# Patient Record
Sex: Male | Born: 1965 | Race: White | Hispanic: No | Marital: Married | Smoking: Current every day smoker
Health system: Southern US, Community
[De-identification: ages and names within clinical notes are randomized; demographics above are authoritative.]

## PROBLEM LIST (undated history)

## (undated) DIAGNOSIS — M05639 Rheumatoid arthritis of unspecified wrist with involvement of other organs and systems: Secondary | ICD-10-CM

## (undated) HISTORY — PX: NASAL SEPTUM SURGERY: SHX37

---

## 2020-11-07 ENCOUNTER — Other Ambulatory Visit: Payer: Self-pay

## 2020-11-07 ENCOUNTER — Ambulatory Visit (INDEPENDENT_AMBULATORY_CARE_PROVIDER_SITE_OTHER): Payer: 59

## 2020-11-07 ENCOUNTER — Ambulatory Visit
Admission: EM | Admit: 2020-11-07 | Discharge: 2020-11-07 | Disposition: A | Discharge status: Home / Self Care | Payer: 59

## 2020-11-07 DIAGNOSIS — M79671 Pain in right foot: Secondary | ICD-10-CM

## 2020-11-07 MED ORDER — COLCHICINE 0.6 MG PO TABS: 0.6000 mg | ORAL_TABLET | Freq: Every day | ORAL | 0 refills | Status: DC

## 2020-11-07 MED ORDER — DEXAMETHASONE SODIUM PHOSPHATE 10 MG/ML IJ SOLN
10.0000 mg | Freq: Once | INTRAMUSCULAR | Status: AC
  Administered 2020-11-07: 10 mg via INTRAMUSCULAR

## 2020-11-07 NOTE — ED Triage Notes (Signed)
Pt presents with right foot pain after stepping down from bed of truck, pain in in the ball of right foot

## 2020-11-07 NOTE — Discharge Instructions (Addendum)
Prescribed colchicine/take as directed Take OTC Tylenol as needed for pain Follow RICE instruction that is attached Follow-up with PCP Return or go to ED if you develop any new or worsening of

## 2020-11-07 NOTE — ED Provider Notes (Addendum)
[facility]   [id number] 11/07/20 Arrival Time: 1647   Chief Complaint  Patient presents with  . Foot Pain     SUBJECTIVE: History from: patient.  Derek Keller is a 54 y.o. male who presented to the urgent care for complaint of right foot pain for the past few days.  I developed the symptom after stepping down in a hole.  He localizes the pain to the right foot he describes the pain as constant and achy.  He has tried OTC medications without relief.  His symptoms are made worse with ROM.  He denies similar symptoms in the past.  Denies chills, fever, nausea, vomiting, diarrhea  ROS: As per HPI.  All other pertinent ROS negative.     History reviewed. No pertinent past medical history. History reviewed. No pertinent surgical history. Allergies  Allergen Reactions  . Augmentin [Amoxicillin-Pot Clavulanate] Nausea And Vomiting   No current facility-administered medications on file prior to encounter.   Current Outpatient Medications on File Prior to Encounter  Medication Sig Dispense Refill  . cyanocobalamin 1000 MCG tablet Take by mouth.    Marland Kitchen azelastine (ASTELIN) 0.1 % nasal spray Place 1 spray into both nostrils 2 (two) times daily.    . cetirizine (ZYRTEC) 10 MG tablet Take by mouth.    . folic acid (FOLVITE) 1 MG tablet Take 1 mg by mouth daily.    . methotrexate 2.5 MG tablet Take 20 mg by mouth once a week.    . pantoprazole (PROTONIX) 40 MG tablet Take 40 mg by mouth daily.    Harriette Ohara XR 11 MG TB24 Take 1 tablet by mouth daily.     Social History   Socioeconomic History  . Marital status: Married    Spouse name: Not on file  . Number of children: Not on file  . Years of education: Not on file  . Highest education level: Not on file  Occupational History  . Not on file  Tobacco Use  . Smoking status: Current Some Day Smoker  . Smokeless tobacco: Never Used  Substance and Sexual Activity  . Alcohol use: Not Currently  . Drug use: Not Currently  .  Sexual activity: Not on file  Other Topics Concern  . Not on file  Social History Narrative  . Not on file   Social Determinants of Health   Financial Resource Strain:   . Difficulty of Paying Living Expenses: Not on file  Food Insecurity:   . Worried About Programme researcher, broadcasting/film/video in the Last Year: Not on file  . Ran Out of Food in the Last Year: Not on file  Transportation Needs:   . Lack of Transportation (Medical): Not on file  . Lack of Transportation (Non-Medical): Not on file  Physical Activity:   . Days of Exercise per Week: Not on file  . Minutes of Exercise per Session: Not on file  Stress:   . Feeling of Stress : Not on file  Social Connections:   . Frequency of Communication with Friends and Family: Not on file  . Frequency of Social Gatherings with Friends and Family: Not on file  . Attends Religious Services: Not on file  . Active Member of Clubs or Organizations: Not on file  . Attends Banker Meetings: Not on file  . Marital Status: Not on file  Intimate Partner Violence:   . Fear of Current or Ex-Partner: Not on file  . Emotionally Abused: Not on file  . Physically  Abused: Not on file  . Sexually Abused: Not on file   Family History  Problem Relation Age of Onset  . Hypertension Mother   . Hypertension Father     OBJECTIVE:  Vitals:   11/07/20 1707  BP: 126/76  Pulse: 85  Resp: 20  Temp: 98.8 F (37.1 C)  SpO2: 98%     Physical Exam Vitals and nursing note reviewed.  Constitutional:      General: He is not in acute distress.    Appearance: Normal appearance. He is normal weight. He is not ill-appearing, toxic-appearing or diaphoretic.  Cardiovascular:     Rate and Rhythm: Normal rate and regular rhythm.     Pulses: Normal pulses.     Heart sounds: Normal heart sounds. No murmur heard.  No friction rub. No gallop.   Pulmonary:     Effort: Pulmonary effort is normal. No respiratory distress.     Breath sounds: Normal breath  sounds. No stridor. No wheezing, rhonchi or rales.  Chest:     Chest wall: No tenderness.  Musculoskeletal:        General: Tenderness present.     Right foot: Tenderness present.     Left foot: Normal.     Comments: The right foot is without any obvious asymmetry when compared to the left foot.  There is no ecchymosis, open wound, lesion or warmth present.  Limited range of motion due to pain.  Neurovascular status intact  Neurological:     Mental Status: He is alert and oriented to person, place, and time.    LABS:  No results found for this or any previous visit (from the past 24 hour(s)).   RADIOLOGY:  DG Foot Complete Right  Result Date: 11/07/2020 CLINICAL DATA:  Right foot injury EXAM: RIGHT FOOT COMPLETE - 3+ VIEW COMPARISON:  None. FINDINGS: No definite acute displaced fracture or malalignment. Small ossicles adjacent to the head of the fifth metatarsal with adjacent nonspecific soft tissue calcification. No radiopaque foreign body. IMPRESSION: No definite acute osseous abnormality. Electronically Signed   By: Jasmine Pang M.D.   On: 11/07/2020 18:15     Right foot x-ray is negative for bony abnormality including fracture or dislocation.  I have reviewed the x-ray myself and the radiologist interpretation.  I am in agreement with the radiologist interpretation.   ASSESSMENT & PLAN:  1. Right foot pain     Meds ordered this encounter  Medications  . dexamethasone (DECADRON) injection 10 mg  . colchicine 0.6 MG tablet    Sig: Take 1 tablet (0.6 mg total) by mouth daily.    Dispense:  30 tablet    Refill:  0    Discharge instructions  Prescribed colchicine/take as directed Take OTC Tylenol as needed for pain Follow RICE instruction that is attached Follow-up with PCP Return or go to ED if you develop any new or worsening of   Reviewed expectations re: course of current medical issues. Questions answered. Outlined signs and symptoms indicating need for more  acute intervention. Patient verbalized understanding. After Visit Summary given.         Durward Parcel, FNP 11/07/20 1832    Durward Parcel, FNP 11/07/20 [phone]

## 2021-06-13 ENCOUNTER — Other Ambulatory Visit: Payer: Self-pay

## 2021-06-13 ENCOUNTER — Ambulatory Visit
Admission: EM | Admit: 2021-06-13 | Discharge: 2021-06-13 | Disposition: A | Discharge status: Home / Self Care | Payer: BC Managed Care – PPO

## 2021-06-13 ENCOUNTER — Encounter: Payer: Self-pay | Admitting: Emergency Medicine

## 2021-06-13 DIAGNOSIS — H66001 Acute suppurative otitis media without spontaneous rupture of ear drum, right ear: Secondary | ICD-10-CM | POA: Diagnosis not present

## 2021-06-13 HISTORY — DX: Rheumatoid arthritis of unspecified wrist with involvement of other organs and systems: M05.639

## 2021-06-13 MED ORDER — AMOXICILLIN 500 MG PO CAPS: 500.0000 mg | ORAL_CAPSULE | Freq: Two times a day (BID) | ORAL | 0 refills | Status: AC

## 2021-06-13 NOTE — ED Provider Notes (Signed)
[facility]   [id number] 06/13/21 Arrival Time: 9735  CC:EAR PAIN  SUBJECTIVE: History from: patient.  Derek Keller is a 55 y.o. male who presents with of RT ear pain x 1 day.  Admits to swimming.  Patient states the pain is constant and 2/10  in character.  Patient has tried left over ear drops without relief.  Symptoms are made worse with lying down.  Reports similar symptoms in the past    Denies fever, chills, fatigue, sinus pain, rhinorrhea, ear discharge, sore throat, SOB, wheezing, chest pain, nausea, changes in bowel or bladder habits.    ROS: As per HPI.  All other pertinent ROS negative.     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 Allergies  Allergen Reactions   Tropicamide Other (See Comments) and Palpitations    1/2% drops, optometry   Cat Hair Extract Other (See Comments)    Sneezing   Dust Mite Extract Other (See Comments)    Sneezing   Augmentin [Amoxicillin-Pot Clavulanate] Nausea And Vomiting   No current facility-administered medications on file prior to encounter.   Current Outpatient Medications on File Prior to Encounter  Medication Sig Dispense Refill   Abatacept (ORENCIA CLICKJECT) 125 MG/ML SOAJ Inject into the skin.     cetirizine (ZYRTEC) 10 MG tablet Take by mouth.     cyanocobalamin 1000 MCG tablet Take by mouth.     folic acid (FOLVITE) 1 MG tablet Take 1 mg by mouth daily.     methotrexate 2.5 MG tablet Take 20 mg by mouth once a week.     pantoprazole (PROTONIX) 40 MG tablet Take 40 mg by mouth daily.     prednisoLONE 5 MG TABS tablet Take 5 mg by mouth.     Social History   Socioeconomic History   Marital status: Married    Spouse name: Not on file   Number of children: Not on file   Years of education: Not on file   Highest education level: Not on file  Occupational History   Not on file  Tobacco Use   Smoking status:  Every Day   Smokeless tobacco: Never  Substance and Sexual Activity   Alcohol use: Not Currently   Drug use: Not Currently   Sexual activity: Not on file  Other Topics Concern   Not on file  Social History Narrative   Not on file   Social Determinants of Health   Financial Resource Strain: Not on file  Food Insecurity: Not on file  Transportation Needs: Not on file  Physical Activity: Not on file  Stress: Not on file  Social Connections: Not on file  Intimate Partner Violence: Not on file   Family History  Problem Relation Age of Onset   Hypertension Mother    Hypertension Father     OBJECTIVE:  Vitals:   06/13/21 0826 06/13/21 0827  BP: 120/65   Pulse: 78   Resp: 18   Temp: 98.5 F (36.9 C)   TempSrc: Oral   SpO2: 98%   Weight:  190 lb (86.2 kg)  Height:  6\' 1"  (1.854 m)     General appearance: alert; well-appearing, nontoxic; speaking in full sentences and tolerating own secretions HEENT: NCAT; Ears: LT EAC clear, RT EAC swollen with white exudate, unable to visualize TM, LT TM pearly gray; Eyes: PERRL.  EOM grossly intact.Nose: nares patent  without rhinorrhea, Throat: oropharynx clear, tonsils non erythematous or enlarged, uvula midline  Neck: supple without LAD Lungs: unlabored respirations, symmetrical air entry; cough: absent; no respiratory distress; CTAB Heart: regular rate and rhythm.  Skin: warm and dry Psychological: alert and cooperative; normal mood and affect  No results found.   ASSESSMENT & PLAN:  1. Non-recurrent acute suppurative otitis media of right ear without spontaneous rupture of tympanic membrane     Meds ordered this encounter  Medications   amoxicillin (AMOXIL) 500 MG capsule    Sig: Take 1 capsule (500 mg total) by mouth 2 (two) times daily for 10 days.    Dispense:  20 capsule    Refill:  0    Order Specific Question:   Supervising Provider    Answer:   Eustace Moore [[id number]]     Rest and drink plenty of  fluids Prescribed amoxicillin Take medication as directed and to completion Continue to use OTC ibuprofen and/ or tylenol as needed for pain control Follow up with PCP if symptoms persists Return here or go to the ER if you have any new or worsening symptoms   Reviewed expectations re: course of current medical issues. Questions answered. Outlined signs and symptoms indicating need for more acute intervention. Patient verbalized understanding. After Visit Summary given.          Rennis Harding, PA-C 06/13/21 0840

## 2021-06-13 NOTE — Discharge Instructions (Addendum)
Rest and drink plenty of fluids Prescribed amoxicillin Take medication as directed and to completion Continue to use OTC ibuprofen and/ or tylenol as needed for pain control Follow up with PCP if symptoms persists Return here or go to the ER if you have any new or worsening symptoms  

## 2021-06-13 NOTE — ED Triage Notes (Signed)
Pt here with right ear pain since yesterday morning. Tried to use abx drops her daughter had with no relief. States headache at base of skull as well. States he was swimming last week.

## 2021-12-07 ENCOUNTER — Other Ambulatory Visit: Payer: Self-pay

## 2021-12-07 ENCOUNTER — Ambulatory Visit (HOSPITAL_COMMUNITY)
Admission: EM | Admit: 2021-12-07 | Discharge: 2021-12-07 | Disposition: A | Discharge status: Home / Self Care | Payer: Worker's Compensation | Attending: Physician Assistant | Admitting: Physician Assistant

## 2021-12-07 ENCOUNTER — Encounter (HOSPITAL_COMMUNITY): Payer: Self-pay

## 2021-12-07 DIAGNOSIS — S0001XA Abrasion of scalp, initial encounter: Secondary | ICD-10-CM | POA: Diagnosis not present

## 2021-12-07 DIAGNOSIS — Z23 Encounter for immunization: Secondary | ICD-10-CM

## 2021-12-07 DIAGNOSIS — S0101XA Laceration without foreign body of scalp, initial encounter: Secondary | ICD-10-CM

## 2021-12-07 DIAGNOSIS — S0990XA Unspecified injury of head, initial encounter: Secondary | ICD-10-CM

## 2021-12-07 MED ORDER — TETANUS-DIPHTH-ACELL PERTUSSIS 5-2.5-18.5 LF-MCG/0.5 IM SUSY
0.5000 mL | PREFILLED_SYRINGE | Freq: Once | INTRAMUSCULAR | Status: AC
  Administered 2021-12-07: 0.5 mL via INTRAMUSCULAR

## 2021-12-07 MED ORDER — TETANUS-DIPHTH-ACELL PERTUSSIS 5-2.5-18.5 LF-MCG/0.5 IM SUSY
PREFILLED_SYRINGE | INTRAMUSCULAR | Status: AC
  Filled 2021-12-07: qty 0.5

## 2021-12-07 MED ORDER — BACITRACIN ZINC 500 UNIT/GM EX OINT
TOPICAL_OINTMENT | CUTANEOUS | Status: AC
  Filled 2021-12-07: qty 0.9

## 2021-12-07 MED ORDER — BACITRACIN ZINC 500 UNIT/GM EX OINT: TOPICAL_OINTMENT | Freq: Once | CUTANEOUS | Status: AC

## 2021-12-07 NOTE — ED Triage Notes (Signed)
Pt presents with superficial abrasion to front of head and laceration to right side of head from tree branch trunk.

## 2021-12-07 NOTE — ED Provider Notes (Signed)
[facility]    CSN: [id number] Arrival date & time: 12/07/21  1025      History   Chief Complaint Chief Complaint  Patient presents with   Laceration    HPI Derek Keller is a 55 y.o. male.   Patient here today for evaluation of head injury that occurred at work around 10 AM this morning. 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 He denies any LOC at time of accident. He has not had any neck pain, nausea, vomiting, vision changes or other concerns.   The history is provided by the patient.  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   History reviewed. No pertinent surgical history.     Home Medications    Prior to Admission medications   Medication Sig Start Date End Date Taking? Authorizing Provider  Abatacept (ORENCIA CLICKJECT) 0000000 MG/ML SOAJ Inject into the skin. 05/29/21   [provider]  cetirizine (ZYRTEC) 10 MG tablet Take by mouth.    [provider]  cyanocobalamin 1000 MCG tablet Take by mouth. 12/10/17   [provider]  folic acid (FOLVITE) 1 MG tablet Take 1 mg by mouth daily. 09/03/20   [provider]  methotrexate 2.5 MG tablet Take 20 mg by mouth once a week. 11/06/20   [provider]  pantoprazole (PROTONIX) 40 MG tablet Take 40 mg by mouth daily. 09/25/20   [provider]  prednisoLONE 5 MG TABS tablet Take 5 mg by mouth.    [provider]    Family History Family History  Problem Relation Age of Onset   Hypertension Mother    Hypertension Father     Social History Social History   Tobacco Use   Smoking status: Every Day   Smokeless  tobacco: Never  Substance Use Topics   Alcohol use: Not Currently   Drug use: Not Currently     Allergies   Tropicamide, Cat hair extract, Dust mite extract, and Augmentin [amoxicillin-pot clavulanate]   Review of Systems Review of Systems  Constitutional:  Negative for chills and fever.  Eyes:  Negative for discharge, redness and visual disturbance.  Respiratory:  Negative for shortness of breath.   Gastrointestinal:  Negative for nausea and vomiting.  Musculoskeletal:  Negative for neck pain.  Skin:  Positive for wound. Negative for color change.  Neurological:  Negative for dizziness and headaches.    Physical Exam Triage Vital Signs ED Triage Vitals  Enc Vitals Group     BP 12/07/21 1106 124/79     Pulse Rate 12/07/21 1103 68     Resp 12/07/21 1103 18     Temp 12/07/21 1103 98.6 F (37 C)     Temp Source 12/07/21 1103 Oral     SpO2 12/07/21 1103 99 %     Weight --      Height --      Head Circumference --      Peak Flow --      Pain Score 12/07/21 1103 5     Pain Loc --  Pain Edu? --      Excl. in [place]? --    No data found.  Updated Vital Signs BP 124/79    Pulse 68    Temp 98.6 F (37 C) (Oral)    Resp 18    SpO2 99%    Physical Exam Vitals and nursing note reviewed.  Constitutional:      General: He is not in acute distress.    Appearance: Normal appearance. He is not ill-appearing.  HENT:     Head: Normocephalic and atraumatic.     Nose: Nose normal. No congestion or rhinorrhea.  Eyes:     Conjunctiva/sclera: Conjunctivae normal.  Cardiovascular:     Rate and Rhythm: Normal rate.  Pulmonary:     Effort: Pulmonary effort is normal.  Skin:    Comments: More superficial V-shaped abrasion to midline frontal scalp with minimal active bleeding, ~ 7 cm laceration to right posterior temporal scalp with minimal active bleeding, wounds cleaned in triage- no FB noted  Neurological:     Mental Status: He is alert.  Psychiatric:        Mood and Affect:  Mood normal.        Behavior: Behavior normal.     UC Treatments / Results  Labs (all labs ordered are listed, but only abnormal results are displayed) Labs Reviewed - No data to display  EKG   Radiology No results found.  Procedures Laceration Repair  Date/Time: 12/07/2021 12:14 PM Performed by: Francene Finders, PA-C Authorized by: Francene Finders, PA-C   Consent:    Consent obtained:  Verbal   Consent given by:  Patient   Risks, benefits, and alternatives were discussed: yes     Risks discussed:  Infection and pain   Alternatives discussed:  No treatment Universal protocol:    Procedure explained and questions answered to patient or proxy's satisfaction: yes     Relevant documents present and verified: yes     Required blood products, implants, devices, and special equipment available: yes     Patient identity confirmed:  Provided demographic data Anesthesia:    Anesthesia method:  None Laceration details:    Location:  Scalp   Scalp location:  R temporal   Length (cm):  7 Exploration:    Contaminated: no   Treatment:    Area cleansed with:  Chlorhexidine   Amount of cleaning:  Standard Skin repair:    Repair method:  Staples   Number of staples:  6 Approximation:    Approximation:  Close Post-procedure details:    Dressing:  Open (no dressing)   Procedure completion:  Tolerated well, no immediate complications (including critical care time)  Medications Ordered in UC Medications  Tdap (BOOSTRIX) injection 0.5 mL (0.5 mLs Intramuscular Given 12/07/21 1145)  bacitracin ointment ( Topical Given 12/07/21 1159)    Initial Impression / Assessment and Plan / UC Course  I have reviewed the triage vital signs and the nursing notes.  Pertinent labs & imaging results that were available during my care of the patient were reviewed by me and considered in my medical decision making (see chart for details).   Tetanus vaccination administered in office. Laceration  repaired. Frontal abrasion cleaned and dressed. Discussed follow up with any concerns including those that may be related to concussion, etc. Recommended he return in 7-10 days for staple removal.  Final Clinical Impressions(s) / UC Diagnoses   Final diagnoses:  Injury of head, initial encounter  Laceration of scalp, initial  encounter  Abrasion of scalp, initial encounter     Discharge Instructions      Keep wounds clean, follow up in 7-10 days for staple removal.     ED Prescriptions   None    PDMP not reviewed this encounter.   Francene Finders, PA-C 12/07/21 1215

## 2021-12-07 NOTE — ED Triage Notes (Signed)
Pt reports incident occurred @ approx 1000. Denies any LOC, neck pain, HA, vision changes, or n/v.

## 2021-12-07 NOTE — Discharge Instructions (Signed)
Keep wounds clean, follow up in 7-10 days for staple removal.

## 2021-12-08 ENCOUNTER — Other Ambulatory Visit: Payer: Self-pay

## 2021-12-08 ENCOUNTER — Ambulatory Visit
Admission: EM | Admit: 2021-12-08 | Discharge: 2021-12-08 | Disposition: A | Discharge status: Home / Self Care | Payer: BC Managed Care – PPO

## 2021-12-08 ENCOUNTER — Encounter: Payer: Self-pay | Admitting: Emergency Medicine

## 2021-12-08 DIAGNOSIS — S0101XD Laceration without foreign body of scalp, subsequent encounter: Secondary | ICD-10-CM

## 2021-12-08 DIAGNOSIS — Z5189 Encounter for other specified aftercare: Secondary | ICD-10-CM

## 2021-12-08 NOTE — ED Provider Notes (Signed)
[facility]    CSN: [id number] Arrival date & time: 12/08/21  4098      History   Chief Complaint No chief complaint on file.   HPI Quintan Saldivar is a 56 y.o. male presenting for recheck of scalp laceration. Was last at our urgent care 12/07/21 for evaluation of this, per their note:   "Patient here today for evaluation of head injury that occurred at work around 10 AM this morning. 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 He denies any LOC at time of accident. He has not had any neck pain, nausea, vomiting, vision changes or other concerns."  6 staples were applied and Tdap administered. Patient states when he removed the dressing, he was dismayed to see that most of the staples did not actually crossed the line of the wound, and were not holding the wound together.  He is shaves his head regularly and so this is a problem as a large scar will make this more difficult.  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   History reviewed. No pertinent surgical history.     Home Medications    Prior to Admission medications   Medication Sig Start Date End Date Taking? Authorizing Provider  Abatacept (ORENCIA CLICKJECT) 125 MG/ML SOAJ Inject into the skin. 05/29/21   [provider]  cetirizine (ZYRTEC) 10 MG tablet Take by mouth.    [provider]  cyanocobalamin 1000 MCG tablet Take by mouth. 12/10/17   [provider]  folic acid (FOLVITE) 1 MG tablet Take 1 mg by mouth daily. 09/03/20   [provider]  methotrexate 2.5 MG tablet Take 20 mg by mouth once a week. 11/06/20   [provider]  pantoprazole (PROTONIX) 40 MG tablet Take 40  mg by mouth daily. 09/25/20   [provider]  prednisoLONE 5 MG TABS tablet Take 5 mg by mouth.    [provider]    Family History Family History  Problem Relation Age of Onset   Hypertension Mother    Hypertension Father     Social History Social History   Tobacco Use   Smoking status: Every Day   Smokeless tobacco: Never  Substance Use Topics   Alcohol use: Not Currently   Drug use: Not Currently     Allergies   Tropicamide, Cat hair extract, Dust mite extract, and Augmentin [amoxicillin-pot clavulanate]   Review of Systems Review of Systems  Skin:  Positive for wound.  All other systems reviewed and are negative.   Physical Exam Triage Vital Signs ED Triage Vitals  Enc Vitals Group     BP 12/08/21 0827 116/77     Pulse Rate 12/08/21 0827 81     Resp 12/08/21 0827 18     Temp 12/08/21 0827 97.6 F (36.4 C)     Temp Source 12/08/21 0827 Oral     SpO2 12/08/21 0827 100 %     Weight --      Height --      Head Circumference --      Peak Flow --  Pain Score 12/08/21 0828 0     Pain Loc --      Pain Edu? --      Excl. in [place]? --    No data found.  Updated Vital Signs BP 116/77 (BP Location: Right Arm)    Pulse 81    Temp 97.6 F (36.4 C) (Oral)    Resp 18    SpO2 100%   Visual Acuity Right Eye Distance:   Left Eye Distance:   Bilateral Distance:    Right Eye Near:   Left Eye Near:    Bilateral Near:     Physical Exam Vitals reviewed.  Constitutional:      General: He is not in acute distress.    Appearance: Normal appearance. He is not ill-appearing.  HENT:     Head: Normocephalic and atraumatic.  Pulmonary:     Effort: Pulmonary effort is normal.  Skin:    Comments: See image below Scalp: 6 staples in place, only top and bottom staple appear to be holding skin in place. Laceration is now well-scabbed into place.  Neurological:     General: No focal deficit present.     Mental Status: He is alert and oriented to  person, place, and time.  Psychiatric:        Mood and Affect: Mood normal.        Behavior: Behavior normal.        Thought Content: Thought content normal.        Judgment: Judgment normal.       UC Treatments / Results  Labs (all labs ordered are listed, but only abnormal results are displayed) Labs Reviewed - No data to display  EKG   Radiology No results found.  Procedures Procedures (including critical care time)  Medications Ordered in UC Medications - No data to display  Initial Impression / Assessment and Plan / UC Course  I have reviewed the triage vital signs and the nursing notes.  Pertinent labs & imaging results that were available during my care of the patient were reviewed by me and considered in my medical decision making (see chart for details).     This patient is a very pleasant 56 y.o. year old male presenting with wound check.   We last saw this patient 1 day ago, at that time 6 staples were placed for scalp laceration. Presenting today as he noticed at home the staples are not actually holding the wound in place. As it has been 24 hours since injury and the laceration is now well-scabbed into place, discussed treatment options; advised against removal of staples today and restapling as this would only serve to create more scar tissue at this point. He is in agreement. Return in 7 days for removal.   Tdap administered 12/07/21.  He is not immunocompromised.    Final Clinical Impressions(s) / UC Diagnoses   Final diagnoses:  Laceration of skin of scalp, subsequent encounter  Visit for wound check     Discharge Instructions      -Continue washing with gentle soap and water 1-2x daily -Cover while outside/working     ED Prescriptions   None    PDMP not reviewed this encounter.   Rhys Martini, PA-C 12/08/21 [phone]

## 2021-12-08 NOTE — ED Triage Notes (Signed)
Here for recheck of head laceration on head.  Staples were placed.  States he thinks that the staples were missed when closing the wound.

## 2021-12-08 NOTE — Discharge Instructions (Addendum)
-  Continue washing with gentle soap and water 1-2x daily -Cover while outside/working

## 2021-12-16 ENCOUNTER — Other Ambulatory Visit: Payer: Self-pay

## 2021-12-16 ENCOUNTER — Ambulatory Visit
Admission: RE | Admit: 2021-12-16 | Discharge: 2021-12-16 | Disposition: A | Discharge status: Home / Self Care | Payer: BC Managed Care – PPO | Source: Ambulatory Visit

## 2021-12-16 VITALS — BP 121/80 | HR 74 | Temp 98.0°F | Resp 18 | Ht 73.0 in | Wt 197.0 lb

## 2021-12-16 DIAGNOSIS — Z4802 Encounter for removal of sutures: Secondary | ICD-10-CM

## 2021-12-16 NOTE — ED Triage Notes (Addendum)
Pt here to have staples from top of head removed. Staples placed on 12/07/21.

## 2021-12-16 NOTE — ED Provider Notes (Signed)
[facility]   MRN: [id number] DOB: Dec 17, 1965  Subjective:   Derek Keller is a 56 y.o. male presenting for staple removal from having been placed on 12/07/2021.  No fever, drainage of pus or bleeding.  No current facility-administered medications for this encounter.  Current Outpatient Medications:    Abatacept (ORENCIA CLICKJECT) 125 MG/ML SOAJ, Inject into the skin., Disp: , Rfl:    cetirizine (ZYRTEC) 10 MG tablet, Take by mouth., Disp: , Rfl:    cyanocobalamin 1000 MCG tablet, Take by mouth., Disp: , Rfl:    folic acid (FOLVITE) 1 MG tablet, Take 1 mg by mouth daily., Disp: , Rfl:    methotrexate 2.5 MG tablet, Take 20 mg by mouth once a week., Disp: , Rfl:    pantoprazole (PROTONIX) 40 MG tablet, Take 40 mg by mouth daily., Disp: , Rfl:    prednisoLONE 5 MG TABS tablet, Take 5 mg by mouth., Disp: , Rfl:    Allergies  Allergen Reactions   Tropicamide Other (See Comments) and Palpitations    1/2% drops, optometry   Cat Hair Extract Other (See Comments)    Sneezing   Dust Mite Extract Other (See Comments)    Sneezing   Augmentin [Amoxicillin-Pot Clavulanate] Nausea And Vomiting    Past Medical History:  Diagnosis Date   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 No pertinent surgical history.  Family History  Problem Relation Age of Onset   Hypertension Mother    Hypertension Father     Social History   Tobacco Use   Smoking status: Every Day   Smokeless tobacco: Never  Substance Use Topics   Alcohol use: Not Currently   Drug use: Not Currently    ROS   Objective:   Vitals: BP 121/80 (BP Location: Right Arm)    Pulse 74    Temp 98 F (36.7 C) (Oral)    Resp 18    Ht 6\' 1"  (1.854 m)    Wt 197 lb (89.4 kg)    SpO2 99%    BMI 25.99 kg/m   Physical Exam Constitutional:      General: He is not in acute distress.    Appearance: Normal appearance. He is well-developed and normal weight. He  is not ill-appearing, toxic-appearing or diaphoretic.  HENT:     Head: Normocephalic and atraumatic.      Right Ear: External ear normal.     Left Ear: External ear normal.     Nose: Nose normal.     Mouth/Throat:     Pharynx: Oropharynx is clear.  Eyes:     General: No scleral icterus.       Right eye: No discharge.        Left eye: No discharge.     Extraocular Movements: Extraocular movements intact.  Cardiovascular:     Rate and Rhythm: Normal rate.  Pulmonary:     Effort: Pulmonary effort is normal.  Musculoskeletal:     Cervical back: Normal range of motion.  Neurological:     Mental Status: He is alert and oriented to person, place, and time.  Psychiatric:        Mood and Affect: Mood normal.        Behavior: Behavior normal.        Thought Content: Thought content normal.        Judgment: Judgment normal.    Staples removed in their entirety without incident.  Patient tolerated this well.  There was no wound dehiscence.  Assessment and Plan :   PDMP not reviewed this encounter.  1. Encounter for staple removal    Care reviewed, anticipatory guidance provided.  No need for further follow-up.   Wallis Bamberg, PA-C 12/16/21 1428

## 2022-02-28 IMAGING — DX DG FOOT COMPLETE 3+V*R*
3 series · 3 of 3 positions shown · non-contrast
Comparison: None.

CLINICAL DATA: Right foot injury

EXAM:
RIGHT FOOT COMPLETE - 3+ VIEW

[foot ap]
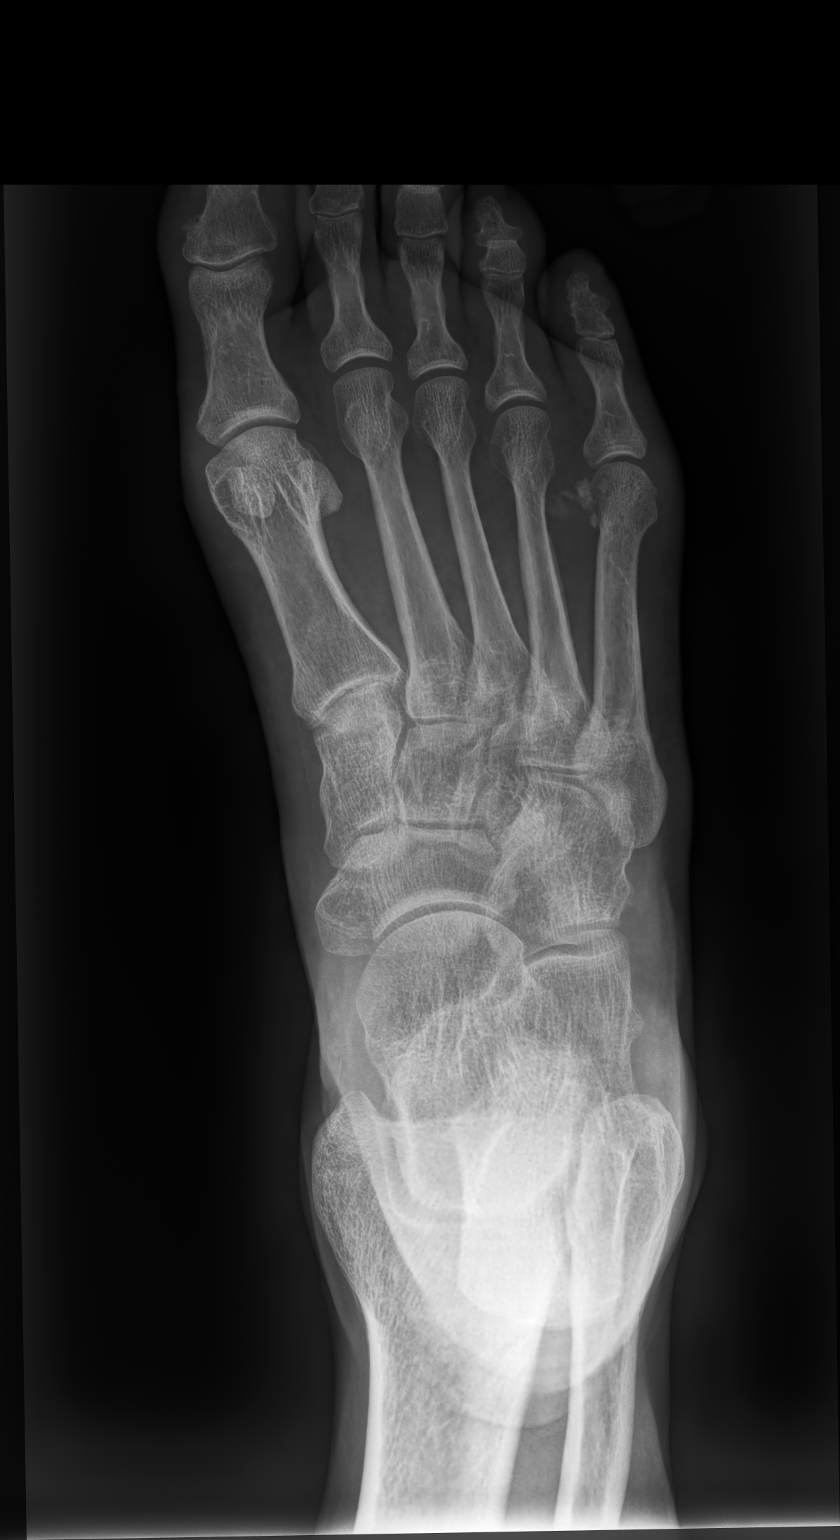

[foot mlo]
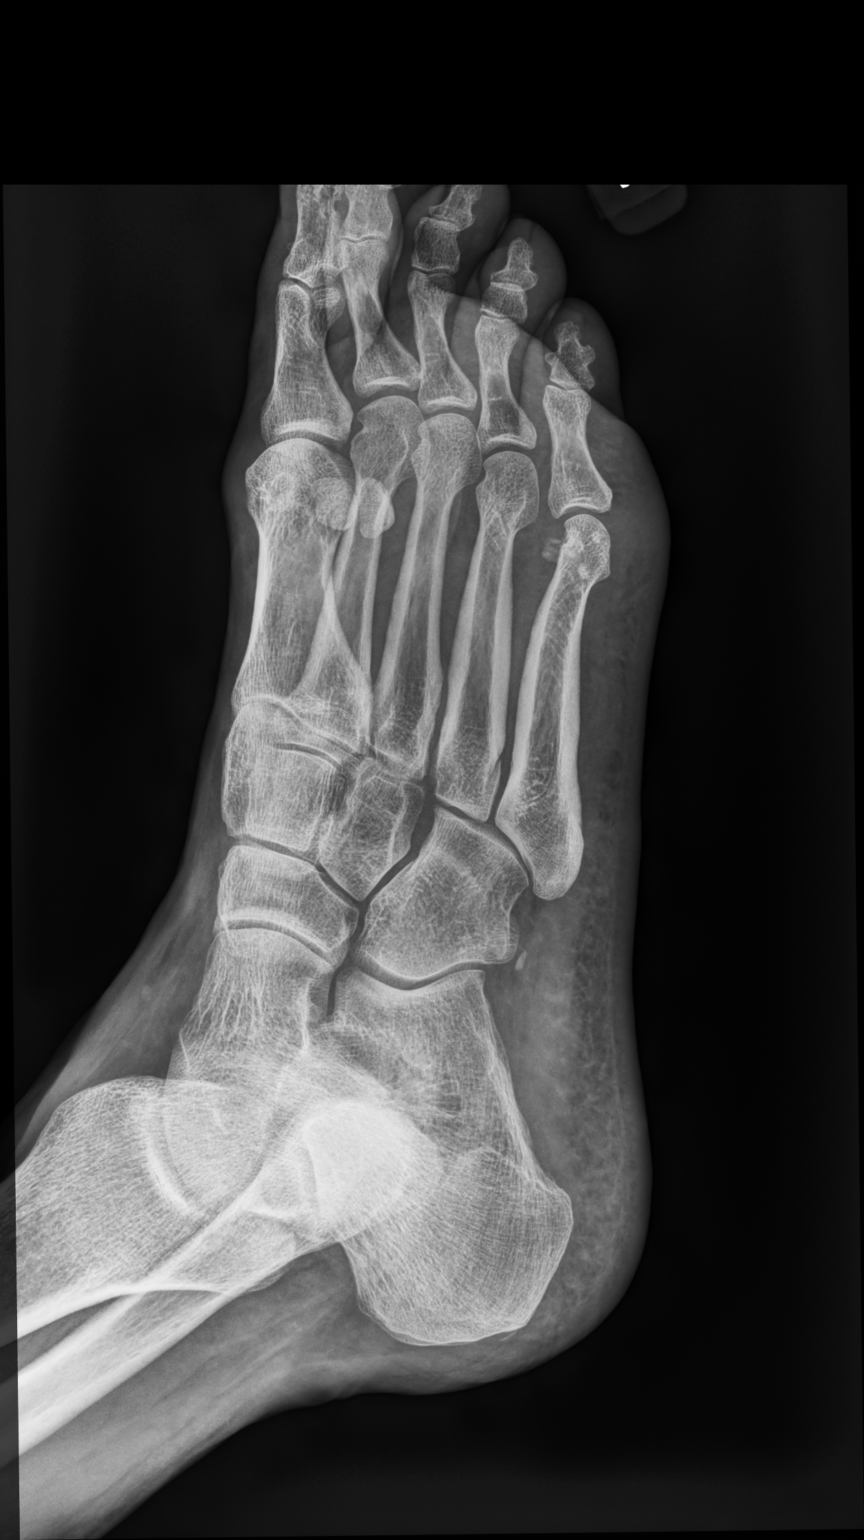

[foot lat]
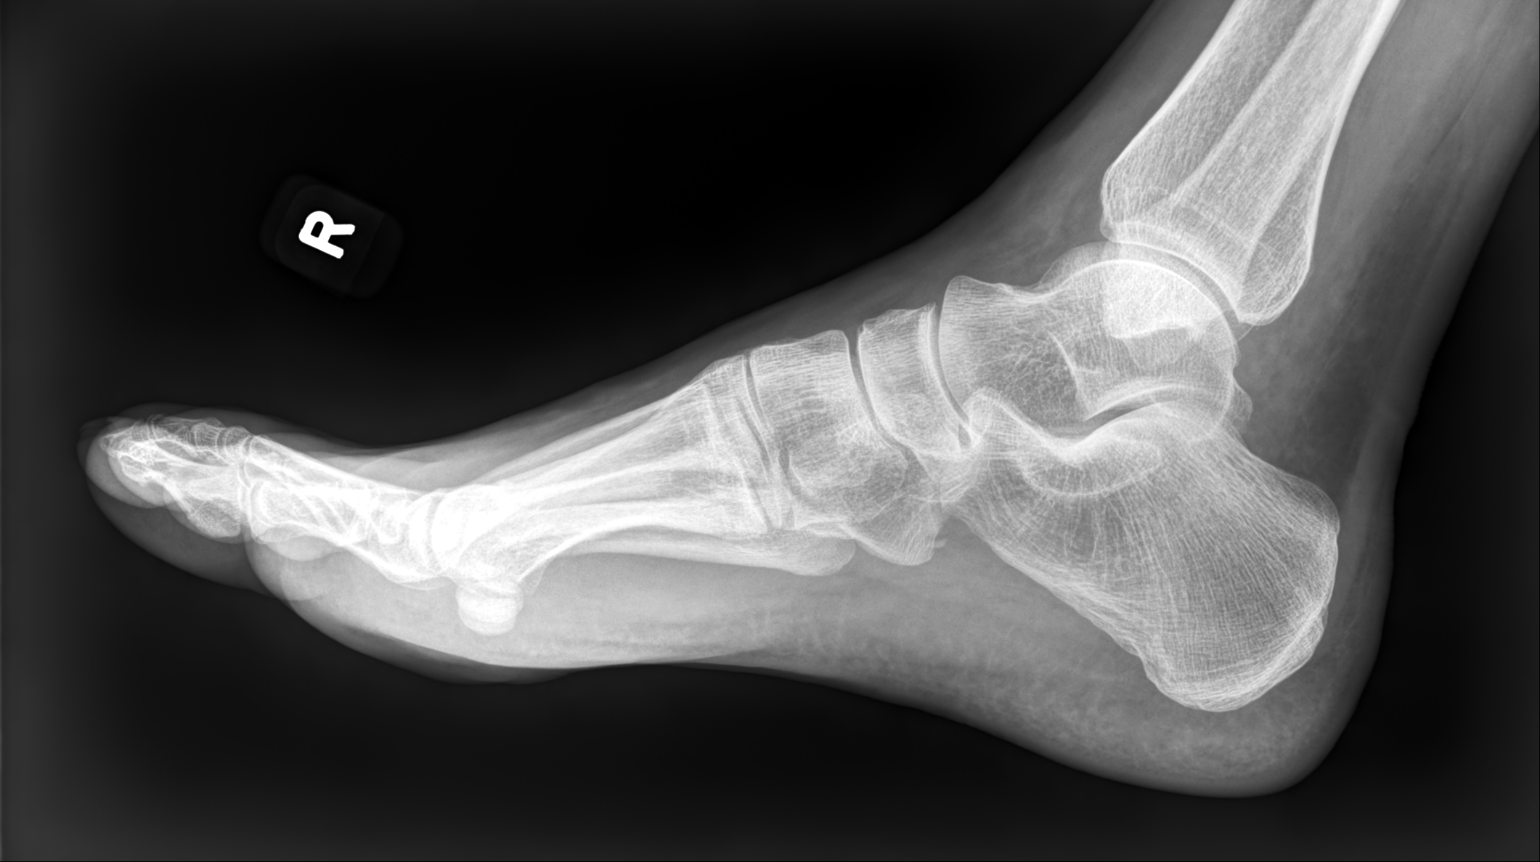

[3 of 3 positions shown; findings below may reference images not displayed]

FINDINGS: No definite acute displaced fracture or malalignment. Small ossicles
adjacent to the head of the fifth metatarsal with adjacent
nonspecific soft tissue calcification. No radiopaque foreign body.
IMPRESSION: No definite acute osseous abnormality.

## 2022-05-25 ENCOUNTER — Ambulatory Visit
Admission: RE | Admit: 2022-05-25 | Discharge: 2022-05-25 | Disposition: A | Discharge status: Home / Self Care | Payer: Managed Care, Other (non HMO) | Source: Ambulatory Visit

## 2022-05-25 VITALS — BP 112/75 | HR 73 | Temp 98.0°F | Resp 18

## 2022-05-25 DIAGNOSIS — H6123 Impacted cerumen, bilateral: Secondary | ICD-10-CM | POA: Diagnosis not present

## 2022-05-25 MED ORDER — CARBAMIDE PEROXIDE 6.5 % OT SOLN: 5.0000 [drp] | Freq: Every day | OTIC | 0 refills | Status: DC | PRN

## 2022-05-25 NOTE — ED Provider Notes (Signed)
[facility]   MRN: [id number] DOB: [birth year]  Subjective:   Derek Keller is a 56 y.o. male with PMH of rheumatoid arthritis presenting for 1 week history of persistent bilateral ear fullness and ear discomfort.  Patient has a history of difficulty with earwax buildup.  Has had minimal tenderness.  No drainage.  No fever.  He does have decreased hearing.  Does not use Q-tips.  No current facility-administered medications for this encounter.  Current Outpatient Medications:    omeprazole (PRILOSEC) 20 MG capsule, Take 20 mg by mouth daily., Disp: , Rfl:    Abatacept (ORENCIA CLICKJECT) 125 MG/ML SOAJ, Inject into the skin., Disp: , Rfl:    cetirizine (ZYRTEC) 10 MG tablet, Take by mouth., Disp: , Rfl:    cyanocobalamin 1000 MCG tablet, Take by mouth., Disp: , Rfl:    folic acid (FOLVITE) 1 MG tablet, Take 1 mg by mouth daily., Disp: , Rfl:    methotrexate 2.5 MG tablet, Take 20 mg by mouth once a week., Disp: , Rfl:    pantoprazole (PROTONIX) 40 MG tablet, Take 40 mg by mouth daily., Disp: , Rfl:    prednisoLONE 5 MG TABS tablet, Take 5 mg by mouth., Disp: , Rfl:    Allergies  Allergen Reactions   Tropicamide Other (See Comments) and Palpitations    1/2% drops, optometry   Cat Hair Extract Other (See Comments)    Sneezing   Dust Mite Extract Other (See Comments)    Sneezing   Augmentin [Amoxicillin-Pot Clavulanate] Nausea And Vomiting    Past Medical History:  Diagnosis Date   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 No pertinent surgical history.  Family History  Problem Relation Age of Onset   Hypertension Mother    Hypertension Father     Social History   Tobacco Use   Smoking status: Every Day   Smokeless tobacco: Never  Substance Use Topics   Alcohol use: Not Currently   Drug use: Not Currently    ROS   Objective:   Vitals: BP 112/75 (BP Location: Right Arm)   Pulse 73   Temp  98 F (36.7 C) (Oral)   Resp 18   SpO2 96%   Physical Exam Constitutional:      General: He is not in acute distress.    Appearance: Normal appearance. He is well-developed and normal weight. He is not ill-appearing, toxic-appearing or diaphoretic.  HENT:     Head: Normocephalic and atraumatic.     Right Ear: Tympanic membrane, ear canal and external ear normal. There is impacted cerumen.     Left Ear: Tympanic membrane, ear canal and external ear normal. There is impacted cerumen.     Nose: Nose normal.     Mouth/Throat:     Pharynx: Oropharynx is clear.  Eyes:     General: No scleral icterus.       Right eye: No discharge.        Left eye: No discharge.     Extraocular Movements: Extraocular movements intact.  Cardiovascular:     Rate and Rhythm: Normal rate.  Pulmonary:     Effort: Pulmonary effort is normal.  Musculoskeletal:     Cervical back: Normal range of motion.  Neurological:     Mental Status: He is alert and oriented to person, place, and time.  Psychiatric:        Mood and Affect: Mood  normal.        Behavior: Behavior normal.        Thought Content: Thought content normal.        Judgment: Judgment normal.    Ear lavage performed using mixture of peroxide and water.  Pressure irrigation performed using a bottle and a thin ear tube.  Bilateral ear lavage.  Curette was used for manual removal.  Assessment and Plan :   PDMP not reviewed this encounter.  1. Bilateral impacted cerumen    Bilateral ear lavage.  General management of cerumen impaction reviewed with patient.  Anticipatory guidance provided. Counseled patient on potential for adverse effects with medications prescribed/recommended today, ER and return-to-clinic precautions discussed, patient verbalized understanding.    Wallis Bamberg, [place] 05/25/22 1339

## 2022-05-25 NOTE — ED Triage Notes (Addendum)
Patient [place] and is now having pain to the left ear. He states he tried to irrigate his ears this morning and is now having muffled hearing from both ears.  The patient states his PCP is tapering him off of the prednisolone (for RA).    Started: Wednesday

## 2022-08-04 ENCOUNTER — Ambulatory Visit
Admission: RE | Admit: 2022-08-04 | Discharge: 2022-08-04 | Disposition: A | Discharge status: Home / Self Care | Payer: Managed Care, Other (non HMO) | Source: Ambulatory Visit | Attending: Family Medicine | Admitting: Family Medicine

## 2022-08-04 VITALS — BP 110/72 | HR 78 | Temp 98.4°F | Resp 16

## 2022-08-04 DIAGNOSIS — L0291 Cutaneous abscess, unspecified: Secondary | ICD-10-CM | POA: Diagnosis not present

## 2022-08-04 MED ORDER — DOXYCYCLINE HYCLATE 100 MG PO CAPS: 100.0000 mg | ORAL_CAPSULE | Freq: Two times a day (BID) | ORAL | 0 refills | Status: DC

## 2022-08-04 NOTE — ED Provider Notes (Signed)
[facility]    CSN: [id number] Arrival date & time: 08/04/22  1746      History   Chief Complaint Chief Complaint  Patient presents with   Abscess    Entered by patient    HPI Derek Keller is a 56 y.o. male.   Patient presenting today with a red swollen abscess type area to the lower abdomen that he first noticed a day or so ago.  It is tender, firm and not draining or bleeding per patient.  He is unsure what initially caused the area whether it was an insect bite or injury.  So far not trying anything over-the-counter for symptoms.  Has a history of MRSA infections.    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   History reviewed. No pertinent surgical history.     Home Medications    Prior to Admission medications   Medication Sig Start Date End Date Taking? Authorizing Provider  doxycycline (VIBRAMYCIN) 100 MG capsule Take 1 capsule (100 mg total) by mouth 2 (two) times daily. 08/04/22  Yes Particia Nearing, PA-C  Abatacept (ORENCIA CLICKJECT) 125 MG/ML SOAJ Inject into the skin. 05/29/21   [provider]  carbamide peroxide (DEBROX) 6.5 % OTIC solution Place 5 drops into both ears daily as needed. 05/25/22   Wallis Bamberg, PA-C  cetirizine (ZYRTEC) 10 MG tablet Take by mouth.    [provider]  cyanocobalamin 1000 MCG tablet Take by mouth. 12/10/17   [provider]  folic acid (FOLVITE) 1 MG tablet Take 1 mg by mouth daily. 09/03/20   [provider]  methotrexate 2.5 MG tablet Take 20 mg by mouth once a week. 11/06/20   [provider]  omeprazole (PRILOSEC) 20 MG capsule Take 20 mg by mouth daily.    [provider]  pantoprazole (PROTONIX) 40 MG tablet Take 40 mg by mouth daily. 09/25/20   [provider]  prednisoLONE 5 MG TABS tablet Take 5 mg by mouth.    [provider]    Family History Family History  Problem Relation Age of Onset   Hypertension Mother    Hypertension Father     Social History Social History   Tobacco Use   Smoking status: Every Day   Smokeless tobacco: Never  Substance Use Topics   Alcohol use: Not Currently   Drug use: Not Currently     Allergies   Tropicamide, Cat hair extract, Dust mite extract, and Augmentin [amoxicillin-pot clavulanate]   Review of Systems Review of Systems Per HPI  Physical Exam Triage Vital Signs ED Triage Vitals  Enc Vitals Group     BP 08/04/22 1759 110/72     Pulse Rate 08/04/22 1759 78     Resp 08/04/22 1759 16     Temp 08/04/22 1759 98.4 F (36.9 C)     Temp Source 08/04/22 1759 Oral     SpO2 08/04/22 1759 93 %     Weight --      Height --      Head Circumference --      Peak Flow --      Pain Score 08/04/22 1803 3     Pain Loc --      Pain Edu? --      Excl. in [place]? --    No data found.  Updated Vital Signs BP 110/72 (BP Location: Right Arm)   Pulse 78   Temp 98.4 F (36.9 C) (Oral)   Resp 16   SpO2 93%   Visual Acuity Right Eye Distance:   Left Eye Distance:   Bilateral Distance:    Right Eye Near:   Left Eye Near:    Bilateral Near:     Physical Exam Vitals and nursing note reviewed.  Constitutional:      Appearance: Normal appearance.  HENT:     Head: Atraumatic.  Eyes:     Extraocular Movements: Extraocular movements intact.     Conjunctiva/sclera: Conjunctivae normal.  Cardiovascular:     Rate and Rhythm: Normal rate and regular rhythm.  Pulmonary:     Effort: Pulmonary effort is normal.     Breath sounds: Normal breath sounds.  Musculoskeletal:        General: Normal range of motion.     Cervical back: Normal range of motion and neck supple.  Skin:    General: Skin is warm.     Comments: Firm erythematous 1 cm mass to the lower abdomen, tender to palpation.  No fluctuance or induration, no active drainage  Neurological:      General: No focal deficit present.     Mental Status: He is oriented to person, place, and time.  Psychiatric:        Mood and Affect: Mood normal.        Thought Content: Thought content normal.        Judgment: Judgment normal.      UC Treatments / Results  Labs (all labs ordered are listed, but only abnormal results are displayed) Labs Reviewed - No data to display  EKG   Radiology No results found.  Procedures Procedures (including critical care time)  Medications Ordered in UC Medications - No data to display  Initial Impression / Assessment and Plan / UC Course  I have reviewed the triage vital signs and the nursing notes.  Pertinent labs & imaging results that were available during my care of the patient were reviewed by me and considered in my medical decision making (see chart for details).     Cover with doxycycline, warm compresses.  Return for worsening symptoms.  No indication for I&D today. Final Clinical Impressions(s) / UC Diagnoses   Final diagnoses:  Abscess   Discharge Instructions   None    ED Prescriptions     Medication Sig Dispense Auth. Provider   doxycycline (VIBRAMYCIN) 100 MG capsule Take 1 capsule (100 mg total) by mouth 2 (two) times daily. 14 capsule Particia Nearing, [place]      PDMP not reviewed this encounter.   Particia Nearing, [place] 08/04/22 [phone]

## 2022-08-04 NOTE — ED Triage Notes (Signed)
Pt has small abscess on abdomen, thinks may be spider bite

## 2023-02-13 ENCOUNTER — Ambulatory Visit
Admission: EM | Admit: 2023-02-13 | Discharge: 2023-02-13 | Disposition: A | Discharge status: Home / Self Care | Payer: Managed Care, Other (non HMO)

## 2023-02-13 ENCOUNTER — Encounter: Payer: Self-pay | Admitting: Emergency Medicine

## 2023-02-13 ENCOUNTER — Other Ambulatory Visit: Payer: Self-pay

## 2023-02-13 DIAGNOSIS — S61432A Puncture wound without foreign body of left hand, initial encounter: Secondary | ICD-10-CM

## 2023-02-13 DIAGNOSIS — M7989 Other specified soft tissue disorders: Secondary | ICD-10-CM

## 2023-02-13 MED ORDER — DOXYCYCLINE HYCLATE 100 MG PO CAPS: 100.0000 mg | ORAL_CAPSULE | Freq: Two times a day (BID) | ORAL | 0 refills | Status: DC

## 2023-02-13 MED ORDER — CHLORHEXIDINE GLUCONATE 4 % EX LIQD: Freq: Every day | CUTANEOUS | 0 refills | Status: DC | PRN

## 2023-02-13 MED ORDER — MUPIROCIN 2 % EX OINT: 1.0000 | TOPICAL_OINTMENT | Freq: Two times a day (BID) | CUTANEOUS | 0 refills | Status: DC

## 2023-02-13 NOTE — ED Provider Notes (Signed)
[facility]    CSN: [id number] Arrival date & time: 02/13/23  1853      History   Chief Complaint Chief Complaint  Patient presents with   Puncture Wound    Splinter or insect stinger - Entered by patient    HPI Derek Keller is a 57 y.o. male.   Patient presenting today with an area of redness to the base of the left thumb that he first noticed yesterday.  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  He felt like there might be a piece of something stuck in the area so he used tweezers to take at the area last night and was unable to remove any pieces.  The area is red, swollen and painful today.  No drainage, bleeding, fevers, chills, decreased range of motion, numbness, tingling.  Not applying anything to the area.  Last tetanus was 2023.    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   History reviewed. No pertinent surgical history.     Home Medications    Prior to Admission medications   Medication Sig Start Date End Date Taking? Authorizing Provider  acetaminophen (TYLENOL) 650 MG CR tablet Take 650 mg by mouth every 8 (eight) hours as needed for pain.   Yes [provider]  chlorhexidine (HIBICLENS) 4 % external liquid Apply topically daily as needed. 02/13/23  Yes Volney American, PA-C  doxycycline (VIBRAMYCIN) 100 MG capsule Take 1 capsule (100 mg total) by mouth 2 (two) times daily. 02/13/23  Yes Volney American, PA-C  mupirocin ointment (BACTROBAN) 2 % Apply 1 Application topically 2 (two) times daily. 02/13/23  Yes Volney American, PA-C  riTUXimab-abbs (TRUXIMA) 100 MG/10ML injection Inject into the vein every 6 (six) months. Unknown dose   Yes [provider]  Abatacept  (ORENCIA CLICKJECT) 0000000 MG/ML SOAJ Inject into the skin. 05/29/21   [provider]  carbamide peroxide (DEBROX) 6.5 % OTIC solution Place 5 drops into both ears daily as needed. 05/25/22   Jaynee Eagles, PA-C  cetirizine (ZYRTEC) 10 MG tablet Take by mouth.    [provider]  cyanocobalamin 1000 MCG tablet Take by mouth. 12/10/17   [provider]  doxycycline (VIBRAMYCIN) 100 MG capsule Take 1 capsule (100 mg total) by mouth 2 (two) times daily. 08/04/22   Volney American, PA-C  folic acid (FOLVITE) 1 MG tablet Take 1 mg by mouth daily. 09/03/20   [provider]  methotrexate 2.5 MG tablet Take 20 mg by mouth once a week. 11/06/20   [provider]  omeprazole (PRILOSEC) 20 MG capsule Take 20 mg by mouth daily.    [provider]  pantoprazole (PROTONIX) 40 MG tablet Take 40 mg by mouth daily. 09/25/20   [provider]  prednisoLONE 5 MG TABS tablet Take 5 mg by mouth.    [provider]    Family History Family History  Problem Relation Age of Onset   Hypertension Mother    Hypertension Father     Social History Social History   Tobacco Use   Smoking status: Every Day    Packs/day: 1.5    Types: Cigarettes, Cigars  Smokeless tobacco: Never  Vaping Use   Vaping Use: Never used  Substance Use Topics   Alcohol use: Not Currently   Drug use: Not Currently     Allergies   Tropicamide, Cat hair extract, Dust mite extract, and Augmentin [amoxicillin-pot clavulanate]   Review of Systems Review of Systems PER HPI  Physical Exam Triage Vital Signs ED Triage Vitals  Enc Vitals Group     BP 02/13/23 1908 113/71     Pulse Rate 02/13/23 1908 79     Resp 02/13/23 1908 20     Temp 02/13/23 1908 98 F (36.7 C)     Temp Source 02/13/23 1908 Oral     SpO2 02/13/23 1908 97 %     Weight --      Height --      Head Circumference --      Peak Flow --      Pain Score 02/13/23 1909 2     Pain Loc --       Pain Edu? --      Excl. in [place]? --    No data found.  Updated Vital Signs BP 113/71 (BP Location: Right Arm)   Pulse 79   Temp 98 F (36.7 C) (Oral)   Resp 20   SpO2 97%   Visual Acuity Right Eye Distance:   Left Eye Distance:   Bilateral Distance:    Right Eye Near:   Left Eye Near:    Bilateral Near:     Physical Exam Vitals and nursing note reviewed.  Constitutional:      Appearance: Normal appearance.  HENT:     Head: Atraumatic.  Eyes:     Extraocular Movements: Extraocular movements intact.     Conjunctiva/sclera: Conjunctivae normal.  Cardiovascular:     Rate and Rhythm: Normal rate and regular rhythm.  Pulmonary:     Effort: Pulmonary effort is normal.     Breath sounds: Normal breath sounds.  Musculoskeletal:        General: Tenderness and signs of injury present. No swelling. Normal range of motion.     Cervical back: Normal range of motion and neck supple.  Skin:    General: Skin is warm and dry.     Comments: Small area of erythema and edema to the base of right thumb, no foreign body appreciable on exam, appears to have a small piece of scabbing.  Attempted removal as patient was concerned that this was a retained foreign body, did not extract any abnormal material and unable to visualize any evidence of foreign body post removing the scab  Neurological:     General: No focal deficit present.     Mental Status: He is oriented to person, place, and time.     Comments: Right hand neurovascularly intact  Psychiatric:        Mood and Affect: Mood normal.        Thought Content: Thought content normal.        Judgment: Judgment normal.      UC Treatments / Results  Labs (all labs ordered are listed, but only abnormal results are displayed) Labs Reviewed - No data to display  EKG   Radiology No results found.  Procedures Procedures (including critical care time)  Medications Ordered in UC Medications - No data to display  Initial Impression  / Assessment and Plan / UC Course  I have reviewed the triage vital signs and the nursing notes.  Pertinent labs & imaging results  that were available during my care of the patient were reviewed by me and considered in my medical decision making (see chart for details).     Probed the area after thorough sterilization with fine tooth forceps, removed small scab but no foreign body appreciable.  Suspect the area is red and swollen secondary to him taking at the area at home so much trying to get any possible foreign body out.  Will cover with antibiotics, Hibiclens, mupirocin, good home wound care, ice, elevation and return for worsening symptoms.  Final Clinical Impressions(s) / UC Diagnoses   Final diagnoses:  Swelling of left hand  Puncture wound of left hand without foreign body, initial encounter     Discharge Instructions      There does not appear to be any retained materials within the wound on your hand.  The swelling is likely secondary to the puncture and irritation from trying to dig something out.  You may perform warm Epsom salt soaks as needed, elevate the hand at rest to help with swelling, ice the area off-and-on.  I have sent over a course of antibiotics, complete the full course and perform once to twice a day wound care with the Hibiclens solution, apply the mupirocin ointment and a dressing.  Follow-up if worsening at any time    ED Prescriptions     Medication Sig Dispense Auth. Provider   doxycycline (VIBRAMYCIN) 100 MG capsule Take 1 capsule (100 mg total) by mouth 2 (two) times daily. 20 capsule Volney American, [place]   chlorhexidine (HIBICLENS) 4 % external liquid Apply topically daily as needed. 120 mL Volney American, PA-C   mupirocin ointment (BACTROBAN) 2 % Apply 1 Application topically 2 (two) times daily. 22 g Volney American, [place]      PDMP not reviewed this encounter.   Volney American, [place] 02/17/23 1133

## 2023-02-13 NOTE — Discharge Instructions (Signed)
There does not appear to be any retained materials within the wound on your hand.  The swelling is likely secondary to the puncture and irritation from trying to dig something out.  You may perform warm Epsom salt soaks as needed, elevate the hand at rest to help with swelling, ice the area off-and-on.  I have sent over a course of antibiotics, complete the full course and perform once to twice a day wound care with the Hibiclens solution, apply the mupirocin ointment and a dressing.  Follow-up if worsening at any time

## 2023-02-13 NOTE — ED Notes (Signed)
Bacitracin and bandaid applied to site. Pt tolerated well.  Site management and infection prevention education reviewed. Pt verbalized understanding.

## 2023-02-13 NOTE — ED Triage Notes (Addendum)
Small area of redness to left distal thumb/lower hand. Pt reports possible insect bite or piece off of power pole.   Last tetanus 2023.

## 2023-06-15 ENCOUNTER — Ambulatory Visit
Admission: EM | Admit: 2023-06-15 | Discharge: 2023-06-15 | Disposition: A | Discharge status: Home / Self Care | Payer: Managed Care, Other (non HMO) | Attending: Nurse Practitioner | Admitting: Nurse Practitioner

## 2023-06-15 DIAGNOSIS — S76212A Strain of adductor muscle, fascia and tendon of left thigh, initial encounter: Secondary | ICD-10-CM | POA: Diagnosis not present

## 2023-06-15 LAB — POCT URINALYSIS DIP (MANUAL ENTRY)
Bilirubin, UA: NEGATIVE
Blood, UA: NEGATIVE
Glucose, UA: NEGATIVE mg/dL
Ketones, POC UA: NEGATIVE mg/dL
Leukocytes, UA: NEGATIVE
Nitrite, UA: NEGATIVE
Protein Ur, POC: NEGATIVE mg/dL
Spec Grav, UA: 1.01 (ref 1.010–1.025)
Urobilinogen, UA: 0.2 E.U./dL
pH, UA: 6 (ref 5.0–8.0)

## 2023-06-15 MED ORDER — PREDNISONE 20 MG PO TABS: 40.0000 mg | ORAL_TABLET | Freq: Every day | ORAL | 0 refills | Status: AC

## 2023-06-15 MED ORDER — DEXAMETHASONE SODIUM PHOSPHATE 10 MG/ML IJ SOLN
10.0000 mg | INTRAMUSCULAR | Status: AC
  Administered 2023-06-15: 10 mg via INTRAMUSCULAR

## 2023-06-15 MED ORDER — KETOROLAC TROMETHAMINE 30 MG/ML IJ SOLN
30.0000 mg | Freq: Once | INTRAMUSCULAR | Status: AC
  Administered 2023-06-15: 30 mg via INTRAMUSCULAR

## 2023-06-15 NOTE — Discharge Instructions (Addendum)
Take medication as prescribed. May take over-the-counter Tylenol as needed for pain or discomfort. Recommend the use of ice or heat.  Apply ice for pain or swelling, heat for spasm or stiffness.  Apply for 20 minutes, remove for 1 hour, then repeat as needed. Gentle stretching or range of motion exercises to help with groin/hip pain. If you experience continued leakage, please follow-up with your primary care physician for further evaluation. Go to the emergency department immediately if you experience loss of bowel or bladder function, worsening pain, or other concerns. Follow-up as needed.

## 2023-06-15 NOTE — ED Triage Notes (Signed)
Pt reports he has a pulled muscle in his groin area on the left side x 1 day.   States he leaked on his way to the bathroom twice today that has never happened before. Slight abdominal pain when pressing.  Took robaxin for relief but provided now.

## 2023-06-15 NOTE — ED Provider Notes (Signed)
[facility]    CSN: [id number] Arrival date & time: 06/15/23  1536      History   Chief Complaint Chief Complaint  Patient presents with   Groin Pain    HPI Derek Keller is a 57 y.o. male.   The history is provided by the patient.   The patient presents with left groin pain that started 1 day ago.  Patient states that the day before symptoms started, he and his wife moved furniture.  He did not think it was anything strenuous, nor did he exert himself.  Patient states on yesterday, he attempted to get out of bed, and could not walk.  He continues to complain of pain to the left groin.  He states that he has decreased range of motion of the left lower extremity.  He also has tenderness to the groin area.  Patient states that he has difficulty straightening his leg due to the pain.  Patient states that the pain radiates into the left thigh.  Patient reports that he also noticed that he has had leakage when he was attempting to use the restroom.  He states this is new.  He states that he does have a history of urinary urgency from time to time due to the amount of caffeine that he drinks.  Patient denies fever, chills, chest pain, abdominal pain, nausea, vomiting, diarrhea, urinary frequency, urgency, hesitancy, low back pain, or flank pain.  Patient states that he took Robaxin for relief.  Patient reports that he does have a history of RA.  Reports that he takes methotrexate and Truxima.  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   History reviewed. No pertinent surgical history.     Home Medications    Prior to Admission medications   Medication Sig Start Date End Date Taking? Authorizing Provider  predniSONE (DELTASONE) 20 MG tablet Take 2 tablets (40 mg total) by mouth daily with breakfast for 5 days. 06/15/23 06/20/23 Yes Nelson Julson-Warren, Sadie Haber, NP  Abatacept (ORENCIA CLICKJECT) 125 MG/ML SOAJ Inject into the skin. 05/29/21   [provider]  acetaminophen (TYLENOL) 650 MG CR tablet Take 650 mg by mouth every 8 (eight) hours as needed for pain.    [provider]  carbamide peroxide (DEBROX) 6.5 % OTIC solution Place 5 drops into both ears daily as needed. 05/25/22   Wallis Bamberg, PA-C  cetirizine (ZYRTEC) 10 MG tablet Take by mouth.    [provider]  chlorhexidine (HIBICLENS) 4 % external liquid Apply topically daily as needed. 02/13/23   Particia Nearing, PA-C  cyanocobalamin 1000 MCG tablet Take by mouth. 12/10/17   [provider]  doxycycline (VIBRAMYCIN) 100 MG capsule Take 1 capsule (100 mg total) by mouth 2 (two) times daily. 08/04/22   Particia Nearing, PA-C  doxycycline (VIBRAMYCIN) 100 MG capsule Take 1 capsule (100 mg total) by mouth 2 (two) times daily. 02/13/23   Particia Nearing, PA-C  folic acid (FOLVITE) 1 MG tablet Take 1 mg by mouth daily. 09/03/20   [provider]  methotrexate 2.5 MG tablet Take 20 mg by mouth once a week. 11/06/20   [provider]  mupirocin ointment (BACTROBAN) 2 % Apply 1 Application topically 2 (two) times daily. 02/13/23   Particia Nearing, PA-C  omeprazole (PRILOSEC) 20 MG capsule Take 20  mg by mouth daily.    [provider]  pantoprazole (PROTONIX) 40 MG tablet Take 40 mg by mouth daily. 09/25/20   [provider]  prednisoLONE 5 MG TABS tablet Take 5 mg by mouth.    [provider]  riTUXimab-abbs (TRUXIMA) 100 MG/10ML injection Inject into the vein every 6 (six) months. Unknown dose    [provider]    Family History Family History  Problem Relation Age of Onset   Hypertension Mother    Hypertension Father     Social History Social History   Tobacco Use   Smoking status: Every Day    Current packs/day: 1.50    Types: Cigarettes, Cigars   Smokeless tobacco: Never   Vaping Use   Vaping status: Never Used  Substance Use Topics   Alcohol use: Not Currently   Drug use: Not Currently     Allergies   Tropicamide, Cat hair extract, Dust mite extract, and Augmentin [amoxicillin-pot clavulanate]   Review of Systems Review of Systems Per HPI  Physical Exam Triage Vital Signs ED Triage Vitals  Encounter Vitals Group     BP 06/15/23 1624 117/76     Systolic BP Percentile --      Diastolic BP Percentile --      Pulse Rate 06/15/23 1622 86     Resp 06/15/23 1622 20     Temp 06/15/23 1622 98.6 F (37 C)     Temp Source 06/15/23 1622 Oral     SpO2 06/15/23 1622 98 %     Weight --      Height --      Head Circumference --      Peak Flow --      Pain Score 06/15/23 1624 6     Pain Loc --      Pain Education --      Exclude from Growth Chart --    No data found.  Updated Vital Signs BP 117/76 (BP Location: Right Arm)   Pulse 86   Temp 98.6 F (37 C) (Oral)   Resp 20   SpO2 98%   Visual Acuity Right Eye Distance:   Left Eye Distance:   Bilateral Distance:    Right Eye Near:   Left Eye Near:    Bilateral Near:     Physical Exam Vitals and nursing note reviewed. Chaperone present: Chief Executive Officer.  Constitutional:      General: He is not in acute distress.    Appearance: Normal appearance.  HENT:     Head: Normocephalic.  Eyes:     Extraocular Movements: Extraocular movements intact.     Pupils: Pupils are equal, round, and reactive to light.  Cardiovascular:     Pulses: Normal pulses.     Heart sounds: Normal heart sounds.  Pulmonary:     Effort: Pulmonary effort is normal.     Breath sounds: Normal breath sounds.  Abdominal:     General: Bowel sounds are normal.     Palpations: Abdomen is soft.     Tenderness: There is no abdominal tenderness.     Hernia: There is no hernia in the left inguinal area or right inguinal area.  Genitourinary:    Penis: Normal.      Testes: Normal.     Epididymis:     Right: Normal.      Left: Normal.     Comments: Tenderness noted to the left groin. Musculoskeletal:     Cervical back: Normal range of  motion.     Left upper leg: Tenderness (Tenderness noted to the left groin) present. No swelling, edema, deformity or lacerations.     Comments: Decreased range of motion of the left lower extremity due to pain.   Lymphadenopathy:     Lower Body: No right inguinal adenopathy. No left inguinal adenopathy.  Skin:    General: Skin is warm and dry.  Neurological:     General: No focal deficit present.     Mental Status: He is alert and oriented to person, place, and time.  Psychiatric:        Mood and Affect: Mood normal.        Behavior: Behavior normal.      UC Treatments / Results  Labs (all labs ordered are listed, but only abnormal results are displayed) Labs Reviewed  POCT URINALYSIS DIP (MANUAL ENTRY)    EKG   Radiology No results found.  Procedures Procedures (including critical care time)  Medications Ordered in UC Medications  ketorolac (TORADOL) 30 MG/ML injection 30 mg (30 mg Intramuscular Given 06/15/23 1705)  dexamethasone (DECADRON) injection 10 mg (10 mg Intramuscular Given 06/15/23 1705)    Initial Impression / Assessment and Plan / UC Course  I have reviewed the triage vital signs and the nursing notes.  Pertinent labs & imaging results that were available during my care of the patient were reviewed by me and considered in my medical decision making (see chart for details).  The patient is well-appearing, he is in no acute distress, vital signs are stable.  Urinalysis was negative for urinary tract infection.  Suspect symptoms are related to a left groin strain/pull.  It is likely that symptoms may have started after the patient was moving the furniture.  Decadron 10 mg IM and Toradol 30 mg IM were administered for inflammation and pain.  Will start patient on prednisone 40 mg for the next 5 days.  Patient was given supportive care  recommendations to include use of over-the-counter Tylenol for pain or discomfort, use of ice or heat, and gentle stretching exercises.  Patient was advised to ensure it is safe for him to take prednisone when he sees his rheumatologist this week.  Patient was given strict ER follow-up precautions to include loss of bowel or bladder function, worsening pain, or other concerns.  Patient is in agreement with this plan of care and verbalizes understanding.  All questions were answered.  Patient stable for discharge.  Final Clinical Impressions(s) / UC Diagnoses   Final diagnoses:  Strain of groin, left, initial encounter     Discharge Instructions      Take medication as prescribed. May take over-the-counter Tylenol as needed for pain or discomfort. Recommend the use of ice or heat.  Apply ice for pain or swelling, heat for spasm or stiffness.  Apply for 20 minutes, remove for 1 hour, then repeat as needed. Gentle stretching or range of motion exercises to help with groin/hip pain. If you experience continued leakage, please follow-up with your primary care physician for further evaluation. Go to the emergency department immediately if you experience loss of bowel or bladder function, worsening pain, or other concerns. Follow-up as needed.      ED Prescriptions     Medication Sig Dispense Auth. Provider   predniSONE (DELTASONE) 20 MG tablet Take 2 tablets (40 mg total) by mouth daily with breakfast for 5 days. 10 tablet Joeli Fenner-Warren, Sadie Haber, NP      PDMP not reviewed this encounter.  Abran Cantor, NP 06/15/23 1713

## 2024-02-04 ENCOUNTER — Encounter: Payer: Self-pay | Admitting: *Deleted

## 2024-02-09 ENCOUNTER — Telehealth: Payer: Self-pay | Admitting: Internal Medicine

## 2024-02-09 NOTE — Telephone Encounter (Signed)
 Patient had called and left a message that we had a referral for him.  I called but left a message asking him to call the office back to schedule

## 2024-02-17 ENCOUNTER — Encounter: Payer: Self-pay | Admitting: Internal Medicine

## 2024-02-17 ENCOUNTER — Ambulatory Visit (INDEPENDENT_AMBULATORY_CARE_PROVIDER_SITE_OTHER): Admitting: Internal Medicine

## 2024-02-17 VITALS — BP 110/61 | HR 64 | Temp 97.9°F | Ht 71.0 in | Wt 200.4 lb

## 2024-02-17 DIAGNOSIS — R0789 Other chest pain: Secondary | ICD-10-CM

## 2024-02-17 DIAGNOSIS — R1310 Dysphagia, unspecified: Secondary | ICD-10-CM

## 2024-02-17 DIAGNOSIS — R1319 Other dysphagia: Secondary | ICD-10-CM

## 2024-02-17 DIAGNOSIS — K219 Gastro-esophageal reflux disease without esophagitis: Secondary | ICD-10-CM

## 2024-02-17 DIAGNOSIS — Z1211 Encounter for screening for malignant neoplasm of colon: Secondary | ICD-10-CM

## 2024-02-17 MED ORDER — PANTOPRAZOLE SODIUM 40 MG PO TBEC: 40.0000 mg | DELAYED_RELEASE_TABLET | Freq: Every day | ORAL | 11 refills | Status: DC

## 2024-02-17 NOTE — Patient Instructions (Signed)
 We will schedule you for upper endoscopy to further evaluate your reflux, difficulty swallowing as well as feeling of food getting stuck.  I may elect to stretch your esophagus depending on findings.  I am going to restart you on pantoprazole 40 mg daily.  I have sent this to your pharmacy.  At the same time as upper endoscopy, we will perform colonoscopy for colon cancer screening purposes.  It was very nice meeting you today.  Dr. Marletta Lor

## 2024-02-17 NOTE — Progress Notes (Signed)
 Primary Care Physician:  Samuella Bruin Primary Gastroenterologist:  Dr. Marletta Lor  Chief Complaint  Patient presents with   Dysphagia    Pt says when he swallows will get air bubble gets stuck or when eats and drinks will have trouble swallowing. He does gag on his own salvia.     HPI:   Derek Keller is a 58 y.o. male who presents to the clinic today by referral from his PCP Lenise Herald for evaluation.  GERD, esophageal dysphagia, chest pain: Patient has had reflux for over 20 years.  Previously on pantoprazole which controls his symptoms though states his insurance paying for this.  Now taking over-the-counter omeprazole 20 mg daily and having breakthrough symptoms of acid reflux and heartburn.  Also notes progressively worsening esophageal dysphagia.  Feels as though food and liquids will get stuck in his substernal region.  No previous upper endoscopy.  Does have rheumatoid arthritis with intermittent prednisone use.  Also takes Celebrex.  Intermittent chest pain when dysphagia symptoms occur.  Colon cancer screening: No prior colonoscopy.  No prior testing of any kind including Cologuard.  No family history colorectal malignancy.  No melena hematochezia.  No abdominal pain or unintentional weight loss.  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  Current Outpatient Medications  Medication Sig Dispense Refill   acetaminophen (TYLENOL) 650 MG CR tablet Take 650 mg by mouth every 8 (eight) hours as needed for pain.     acyclovir (ZOVIRAX) 400 MG tablet Take 400 mg by mouth 3 (three) times daily.     celecoxib (CELEBREX) 200 MG capsule Take 200 mg by mouth 2 (two) times daily.     cetirizine (ZYRTEC) 10 MG tablet Take by mouth.     cyanocobalamin (VITAMIN B12) 1000 MCG tablet Take 1,000 mcg by mouth daily.     folic acid (FOLVITE) 1 MG tablet Take 1 mg by mouth daily.      Methotrexate, PF, 25 MG/0.5ML SOAJ Inject 0.8 mLs into the skin once a week.     omeprazole (PRILOSEC) 20 MG capsule Take 20 mg by mouth daily.     Abatacept (ORENCIA CLICKJECT) 125 MG/ML SOAJ Inject into the skin. (Patient not taking: Reported on 02/17/2024)     carbamide peroxide (DEBROX) 6.5 % OTIC solution Place 5 drops into both ears daily as needed. (Patient not taking: Reported on 02/17/2024) 15 mL 0   chlorhexidine (HIBICLENS) 4 % external liquid Apply topically daily as needed. (Patient not taking: Reported on 02/17/2024) 120 mL 0   cyanocobalamin 1000 MCG tablet Take by mouth. (Patient not taking: Reported on 02/17/2024)     doxycycline (VIBRAMYCIN) 100 MG capsule Take 1 capsule (100 mg total) by mouth 2 (two) times daily. (Patient not taking: Reported on 02/17/2024) 14 capsule 0   doxycycline (VIBRAMYCIN) 100 MG capsule Take 1 capsule (100 mg total) by mouth 2 (two) times daily. (Patient not taking: Reported on 02/17/2024) 20 capsule 0   methotrexate 2.5 MG tablet Take 20 mg by mouth once a week. (Patient not taking: Reported on 02/17/2024)     mupirocin ointment (BACTROBAN) 2 % Apply 1 Application topically 2 (two) times daily. (Patient not taking: Reported on 02/17/2024) 22 g 0   pantoprazole (PROTONIX) 40 MG tablet Take 40 mg by mouth daily. (Patient not taking: Reported on 02/17/2024)     prednisoLONE  5 MG TABS tablet Take 5 mg by mouth. (Patient not taking: Reported on 02/17/2024)     riTUXimab-abbs (TRUXIMA) 100 MG/10ML injection Inject into the vein every 6 (six) months. Unknown dose     No current facility-administered medications for this visit.    Allergies as of 02/17/2024 - Review Complete 02/17/2024  Allergen Reaction Noted   Tropicamide Other (See Comments) and Palpitations 03/14/2014   Cat dander Other (See Comments) 05/16/2015   Dust mite extract Other (See Comments) 05/16/2015   Augmentin [amoxicillin-pot clavulanate] Nausea And Vomiting 11/07/2020    Family History   Problem Relation Age of Onset   Hypertension Mother    Hypertension Father     Social History   Socioeconomic History   Marital status: Married    Spouse name: Not on file   Number of children: Not on file   Years of education: Not on file   Highest education level: Not on file  Occupational History   Not on file  Tobacco Use   Smoking status: Every Day    Current packs/day: 1.50    Types: Cigarettes, Cigars   Smokeless tobacco: Never  Vaping Use   Vaping status: Never Used  Substance and Sexual Activity   Alcohol use: Not Currently   Drug use: Not Currently   Sexual activity: Not on file  Other Topics Concern   Not on file  Social History Narrative   Not on file   Social Drivers of Health   Financial Resource Strain: Low Risk  (01/12/2024)   Received from [facility]   Overall Financial Resource Strain (CARDIA)    Difficulty of Paying Living Expenses: Not hard at all  Food Insecurity: No Food Insecurity (01/12/2024)   Received [facility]   Hunger Vital Sign    Worried About Running Out of Food in the Last Year: Never true    Ran Out of Food in the Last Year: Never true  Transportation Needs: No Transportation Needs (01/12/2024)   Received from [facility] - Transportation    Lack of Transportation (Medical): No    Lack of Transportation (Non-Medical): No  Physical Activity: Unknown (10/25/2021)   Received from [facility], [facility]   Exercise Vital Sign    Days of Exercise per Week: Patient declined    Minutes of Exercise per Session: Not on file  Stress: Unknown (10/25/2021)   Received from [facility], [facility] - Occupational Stress Questionnaire    Feeling of Stress : Patient declined  Social Connections: Unknown (04/02/2022)   Received from [facility], [facility]   Social Network    Social Network: Not on file  Intimate Partner Violence: Unknown (03/05/2022)   Received  from [facility], [facility]   HITS    Physically Hurt: Not on file    Insult or Talk Down To: Not on file    Threaten Physical Harm: Not on file    Scream or Curse: Not on file    Subjective: Review of Systems  Constitutional:  Negative for chills and fever.  HENT:  Negative for congestion and hearing loss.   Eyes:  Negative for blurred vision and double vision.  Respiratory:  Negative for cough and shortness of breath.   Cardiovascular:  Negative for chest pain and palpitations.  Gastrointestinal:  Positive for heartburn. Negative for abdominal pain, blood in stool, constipation, diarrhea, melena and vomiting.       Dysphagia  Genitourinary:  Negative for dysuria and urgency.  Musculoskeletal:  Negative for joint pain and myalgias.  Skin:  Negative for itching and rash.  Neurological:  Negative for dizziness and headaches.  Psychiatric/Behavioral:  Negative for depression. The patient is not nervous/anxious.        Objective: BP 110/61 (BP Location: Right Arm, Patient Position: Sitting, Cuff Size: Large)   Pulse 64   Temp 97.9 F (36.6 C) (Oral)   Ht 5\' 11"  (1.803 m)   Wt 200 lb 6.4 oz (90.9 kg)   BMI 27.95 kg/m  Physical Exam Constitutional:      Appearance: Normal appearance.  HENT:     Head: Normocephalic and atraumatic.  Eyes:     Extraocular Movements: Extraocular movements intact.     Conjunctiva/sclera: Conjunctivae normal.  Cardiovascular:     Rate and Rhythm: Normal rate and regular rhythm.  Pulmonary:     Effort: Pulmonary effort is normal.     Breath sounds: Normal breath sounds.  Abdominal:     General: Bowel sounds are normal.     Palpations: Abdomen is soft.  Musculoskeletal:        General: Normal range of motion.     Cervical back: Normal range of motion and neck supple.  Skin:    General: Skin is warm.  Neurological:     General: No focal deficit present.     Mental Status: He is alert and oriented to person, place, and time.   Psychiatric:        Mood and Affect: Mood normal.        Behavior: Behavior normal.      Assessment/Plan:  1.  GERD, esophageal dysphagia, chest pain- Will schedule for EGD with possible dilation to evaluate for peptic ulcer disease, esophagitis, gastritis, H. Pylori, duodenitis, or other. Will also evaluate for esophageal stricture, Schatzki's ring, esophageal web or other.   I will restart pantoprazole 40 mg daily.  Good Rx coupon printed off for patient.  2.  Colon cancer screening-average risk, at same time as upper endoscopy, we will perform colonoscopy for colon cancer screening purposes.  The risks including infection, bleed, or perforation as well as benefits, limitations, alternatives and imponderables have been reviewed with the patient. Potential for esophageal dilation, biopsy, etc. have also been reviewed.  Questions have been answered. All parties agreeable.  Thank you Lenise Herald for the kind referral.   02/17/2024 8:32 AM   Disclaimer: This note was dictated with voice recognition software. Similar sounding words can inadvertently be transcribed and may not be corrected upon review.

## 2024-02-18 ENCOUNTER — Other Ambulatory Visit: Payer: Self-pay | Admitting: *Deleted

## 2024-02-18 ENCOUNTER — Telehealth: Payer: Self-pay | Admitting: *Deleted

## 2024-02-18 ENCOUNTER — Encounter: Payer: Self-pay | Admitting: *Deleted

## 2024-02-18 MED ORDER — PEG 3350-KCL-NA BICARB-NACL 420 G PO SOLR: 4000.0000 mL | Freq: Once | ORAL | 0 refills | Status: AC

## 2024-02-18 NOTE — Telephone Encounter (Signed)
 [employer] PA for EGD: CPT Code: GEEGD Description: EGD-esophagogastroduodenoscopy Authorization Number: [id number] Case Number: [id number] Review Date: 02/18/2024 2:11:03 PM Expiration Date: 08/16/2024 Status: Your case has been Approved. The prior authorization you submitted, Case [id number], has been received. Additional case status notifications will be sent if you opted in for email notifications. Thank you.

## 2024-03-22 ENCOUNTER — Other Ambulatory Visit: Payer: Self-pay

## 2024-03-22 ENCOUNTER — Encounter (HOSPITAL_COMMUNITY)
Admission: RE | Disposition: A | Discharge status: Home / Self Care | Payer: Self-pay | Source: Home / Self Care | Attending: Internal Medicine

## 2024-03-22 ENCOUNTER — Ambulatory Visit (HOSPITAL_COMMUNITY): Admitting: Certified Registered Nurse Anesthetist

## 2024-03-22 ENCOUNTER — Encounter (HOSPITAL_COMMUNITY): Payer: Self-pay | Admitting: Internal Medicine

## 2024-03-22 ENCOUNTER — Ambulatory Visit (HOSPITAL_COMMUNITY)
Admission: RE | Admit: 2024-03-22 | Discharge: 2024-03-22 | Disposition: A | Discharge status: Home / Self Care | Attending: Internal Medicine | Admitting: Internal Medicine

## 2024-03-22 DIAGNOSIS — K648 Other hemorrhoids: Secondary | ICD-10-CM | POA: Insufficient documentation

## 2024-03-22 DIAGNOSIS — K2970 Gastritis, unspecified, without bleeding: Secondary | ICD-10-CM

## 2024-03-22 DIAGNOSIS — F17210 Nicotine dependence, cigarettes, uncomplicated: Secondary | ICD-10-CM | POA: Insufficient documentation

## 2024-03-22 DIAGNOSIS — R1310 Dysphagia, unspecified: Secondary | ICD-10-CM | POA: Insufficient documentation

## 2024-03-22 DIAGNOSIS — K635 Polyp of colon: Secondary | ICD-10-CM | POA: Insufficient documentation

## 2024-03-22 DIAGNOSIS — Z1211 Encounter for screening for malignant neoplasm of colon: Secondary | ICD-10-CM | POA: Diagnosis not present

## 2024-03-22 DIAGNOSIS — K649 Unspecified hemorrhoids: Secondary | ICD-10-CM

## 2024-03-22 DIAGNOSIS — K219 Gastro-esophageal reflux disease without esophagitis: Secondary | ICD-10-CM | POA: Diagnosis not present

## 2024-03-22 HISTORY — PX: ESOPHAGOGASTRODUODENOSCOPY: SHX5428

## 2024-03-22 HISTORY — PX: ESOPHAGEAL DILATION: SHX303

## 2024-03-22 HISTORY — PX: COLONOSCOPY: SHX5424

## 2024-03-22 SURGERY — COLONOSCOPY
Anesthesia: General

## 2024-03-22 MED ORDER — LACTATED RINGERS IV SOLN: INTRAVENOUS | Status: DC | PRN

## 2024-03-22 MED ORDER — LANSOPRAZOLE 30 MG PO CPDR: 30.0000 mg | DELAYED_RELEASE_CAPSULE | Freq: Every day | ORAL | 11 refills | Status: AC

## 2024-03-22 MED ORDER — PROPOFOL 500 MG/50ML IV EMUL
INTRAVENOUS | Status: DC | PRN
  Administered 2024-03-22: 150 ug/kg/min via INTRAVENOUS
  Administered 2024-03-22: 100 mg via INTRAVENOUS

## 2024-03-22 MED ORDER — LIDOCAINE HCL (CARDIAC) PF 100 MG/5ML IV SOSY
PREFILLED_SYRINGE | INTRAVENOUS | Status: DC | PRN
  Administered 2024-03-22: 100 mg via INTRAVENOUS

## 2024-03-22 MED ORDER — LACTATED RINGERS IV SOLN: INTRAVENOUS | Status: DC

## 2024-03-22 MED ORDER — STERILE WATER FOR IRRIGATION IR SOLN
Status: DC | PRN
  Administered 2024-03-22: 120 mL

## 2024-03-22 NOTE — Op Note (Signed)
 [facility] Patient Name: Derek Keller Procedure Date: 03/22/2024 10:24 AM MRN: [id number] Date of Birth: Oct 27, 1966 Attending MD: Rolando Cliche. Mordechai April , [place], [id number] CSN: [id number] Age: 58 Admit Type: Ambulatory Procedure:                Colonoscopy Indications:              Screening for colorectal malignant neoplasm Providers:                Rolando Cliche. Mordechai April, DO, Willena Harp, [place]                            Wilson, Technician, Theola Fitch Referring MD:              Medicines:                See the Anesthesia note for documentation of the                            administered medications Complications:            No immediate complications. Estimated Blood Loss:     Estimated blood loss was minimal. Procedure:                Pre-Anesthesia Assessment:                           - The anesthesia plan was to use monitored                            anesthesia care (MAC).                           After obtaining informed consent, the colonoscope                            was passed under direct vision. Throughout the                            procedure, the patient's blood pressure, pulse, and                            oxygen saturations were monitored continuously. The                            PCF-HQ190L ([id number] scope was introduced through                            the anus and advanced to the the cecum, identified                            by appendiceal orifice and ileocecal valve. The                            colonoscopy was performed without difficulty. The                            patient tolerated the procedure  well. The quality                            of the bowel preparation was evaluated using the                            BBPS [facility] Bowel Preparation Scale) with scores                            of: Right Colon = 2 (minor amount of residual                            staining, small fragments of stool and/or opaque                             liquid, but mucosa seen well), Transverse Colon = 2                            (minor amount of residual staining, small fragments                            of stool and/or opaque liquid, but mucosa seen                            well) and Left Colon = 2 (minor amount of residual                            staining, small fragments of stool and/or opaque                            liquid, but mucosa seen well). The total BBPS score                            equals 6. The quality of the bowel preparation was                            good. Scope In: 10:52:12 AM Scope Out: 11:10:21 AM Scope Withdrawal Time: 0 hours 15 minutes 42 seconds  Total Procedure Duration: 0 hours 18 minutes 9 seconds  Findings:      Non-bleeding internal hemorrhoids were found.      A 3 mm polyp was found in the sigmoid colon. The polyp was sessile. The       polyp was removed with a cold snare. Resection and retrieval were       complete.      The exam was otherwise without abnormality. Impression:               - Non-bleeding internal hemorrhoids.                           - One 3 mm polyp in the sigmoid colon, removed with                            a cold snare. Resected  and retrieved.                           - The examination was otherwise normal. Moderate Sedation:      Per Anesthesia Care Recommendation:           - Patient has a contact number available for                            emergencies. The signs and symptoms of potential                            delayed complications were discussed with the                            patient. Return to normal activities tomorrow.                            Written discharge instructions were provided to the                            patient.                           - Resume previous diet.                           - Continue present medications.                           - Await pathology results.                           - Repeat colonoscopy  in 7-10 years for surveillance.                           - Return to GI clinic in 3 months. Procedure Code(s):        --- Professional ---                           [phone], Colonoscopy, flexible; with removal of                            tumor(s), polyp(s), or other lesion(s) by snare                            technique Diagnosis Code(s):        --- Professional ---                           Z12.11, Encounter for screening for malignant                            neoplasm of colon                           D12.5, Benign neoplasm of sigmoid colon  K64.8, Other hemorrhoids CPT copyright 2022 American Medical Association. All rights reserved. The codes documented in this report are preliminary and upon coder review may  be revised to meet current compliance requirements. Rolando Cliche. Mordechai April, DO Rolando Cliche. Mordechai April, DO 03/22/2024 11:12:42 AM This report has been signed electronically. Number of Addenda: 0

## 2024-03-22 NOTE — H&P (Signed)
 Primary Care Physician:  Jodi Munroe Primary Gastroenterologist:  Dr. Mordechai April  Pre-Procedure History & Physical: HPI:  Derek Keller is a 58 y.o. male is here for an EGD with possible dilation due to history of dysphagia, GERD and colonoscopy for colon cancer screening purposes.  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 Authorizing Provider  acetaminophen (TYLENOL) 650 MG CR tablet Take 650 mg by mouth every 8 (eight) hours as needed for pain.   Yes [provider]  acyclovir (ZOVIRAX) 400 MG tablet Take 400 mg by mouth 3 (three) times daily. 09/23/23  Yes [provider]  celecoxib (CELEBREX) 200 MG capsule Take 200 mg by mouth 2 (two) times daily.   Yes [provider]  cetirizine (ZYRTEC) 10 MG tablet Take by mouth.   Yes [provider]  cyanocobalamin (VITAMIN B12) 1000 MCG tablet Take 1,000 mcg by mouth daily.   Yes [provider]  folic acid (FOLVITE) 1 MG tablet Take 1 mg by mouth daily. 09/03/20  Yes [provider]  pantoprazole  (PROTONIX ) 40 MG tablet Take 1 tablet (40 mg total) by mouth daily. 02/17/24 02/16/25 Yes Michelle Wnek K, DO  Methotrexate, PF, 25 MG/0.5ML SOAJ Inject 0.8 mLs into the skin once a week.    [provider]    Allergies as of 02/18/2024 - Review Complete 02/17/2024  Allergen Reaction Noted   Tropicamide Other (See Comments) and Palpitations 03/14/2014   Cat dander Other (See Comments) 05/16/2015   Dust mite extract Other (See Comments) 05/16/2015   Augmentin [amoxicillin -pot clavulanate] Nausea And Vomiting 11/07/2020    Family History  Problem Relation Age of Onset   Hypertension Mother    Hypertension Father     Social History   Socioeconomic  History   Marital status: Married    Spouse name: Not on file   Number of children: Not on file   Years of education: Not on file   Highest education level: Not on file  Occupational History   Not on file  Tobacco Use   Smoking status: Every Day    Current packs/day: 1.50    Types: Cigarettes, Cigars   Smokeless tobacco: Never  Vaping Use   Vaping status: Never Used  Substance and Sexual Activity   Alcohol use: Not Currently   Drug use: Not Currently   Sexual activity: Not on file  Other Topics Concern   Not on file  Social History Narrative   Not on file   Social Drivers of Health   Financial Resource Strain: Low Risk  (01/12/2024)   Received from [facility]   Overall Financial Resource Strain (CARDIA)    Difficulty of Paying Living Expenses: Not hard at all  Food Insecurity: No Food Insecurity (01/12/2024)   Received from [facility]   Hunger Vital Sign    Worried About Running Out of Food in the Last Year: Never true    Ran Out of Food in the Last Year: Never true  Transportation Needs: No Transportation Needs (01/12/2024)   Received from [facility] - Transportation    Lack of Transportation (Medical): No    Lack of Transportation (Non-Medical): No  Physical Activity: Unknown (10/25/2021)   Received  from [facility], [facility]   Exercise Vital Sign    Days of Exercise per Week: Patient declined    Minutes of Exercise per Session: Not on file  Stress: Unknown (10/25/2021)   Received from [facility], [facility] - Occupational Stress Questionnaire    Feeling of Stress : Patient declined  Social Connections: Unknown (04/02/2022)   Received from [facility], [facility]   Social Network    Social Network: Not on file  Intimate Partner Violence: Unknown (03/05/2022)   Received from [facility], [facility]   HITS    Physically Hurt: Not on file    Insult or Talk Down To: Not on file     Threaten Physical Harm: Not on file    Scream or Curse: Not on file    Review of Systems: General: Negative for fever, chills, fatigue, weakness. Eyes: Negative for vision changes.  ENT: Negative for hoarseness, difficulty swallowing , nasal congestion. CV: Negative for chest pain, angina, palpitations, dyspnea on exertion, peripheral edema.  Respiratory: Negative for dyspnea at rest, dyspnea on exertion, cough, sputum, wheezing.  GI: See history of present illness. GU:  Negative for dysuria, hematuria, urinary incontinence, urinary frequency, nocturnal urination.  MS: Negative for joint pain, low back pain.  Derm: Negative for rash or itching.  Neuro: Negative for weakness, abnormal sensation, seizure, frequent headaches, memory loss, confusion.  Psych: Negative for anxiety, depression Endo: Negative for unusual weight change.  Heme: Negative for bruising or bleeding. Allergy: Negative for rash or hives.  Physical Exam: Vital signs in last 24 hours: Temp:  [98.2 F (36.8 C)] 98.2 F (36.8 C) (04/22 0917) Pulse Rate:  [62] 62 (04/22 0917) Resp:  [20] 20 (04/22 0917) BP: (129)/(78) 129/78 (04/22 0917) SpO2:  [97 %] 97 % (04/22 0917) Weight:  [88.5 kg] 88.5 kg (04/22 0917)   General:   Alert,  Well-developed, well-nourished, pleasant and cooperative in NAD Head:  Normocephalic and atraumatic. Eyes:  Sclera clear, no icterus.   Conjunctiva pink. Ears:  Normal auditory acuity. Nose:  No deformity, discharge,  or lesions. Msk:  Symmetrical without gross deformities. Normal posture. Extremities:  Without clubbing or edema. Neurologic:  Alert and  oriented x4;  grossly normal neurologically. Skin:  Intact without significant lesions or rashes. Psych:  Alert and cooperative. Normal mood and affect.   Impression/Plan: Derek Keller is here for an EGD with possible dilation due to history of dysphagia, GERD and colonoscopy for colon cancer screening purposes.  Risks, benefits,  limitations, imponderables and alternatives regarding procedure have been reviewed with the patient. Questions have been answered. All parties agreeable.

## 2024-03-22 NOTE — Transfer of Care (Signed)
 Immediate Anesthesia Transfer of Care Note  Patient: Derek Keller  Procedure(s) Performed: COLONOSCOPY EGD (ESOPHAGOGASTRODUODENOSCOPY) DILATION, ESOPHAGUS  Patient Location: Endoscopy Unit  Anesthesia Type:General  Level of Consciousness: awake, alert , and oriented  Airway & Oxygen Therapy: Patient Spontanous Breathing  Post-op Assessment: Report given to RN and Post -op Vital signs reviewed and stable  Post vital signs: Reviewed and stable  Last Vitals:  Vitals Value Taken Time  BP 113/68 03/22/24 1115  Temp 36.7 C 03/22/24 1113  Pulse 61 03/22/24 1115  Resp 21 03/22/24 1115  SpO2 99 % 03/22/24 1115    Last Pain:  Vitals:   03/22/24 1113  TempSrc: Oral  PainSc: 0-No pain      Patients Stated Pain Goal: 5 (03/22/24 0917)  Complications: No notable events documented.

## 2024-03-22 NOTE — Discharge Instructions (Addendum)
 EGD Discharge instructions Please read the instructions outlined below and refer to this sheet in the next few weeks. These discharge instructions provide you with general information on caring for yourself after you leave the hospital. Your doctor may also give you specific instructions. While your treatment has been planned according to the most current medical practices available, unavoidable complications occasionally occur. If you have any problems or questions after discharge, please call your doctor. ACTIVITY You may resume your regular activity but move at a slower pace for the next 24 hours.  Take frequent rest periods for the next 24 hours.  Walking will help expel (get rid of) the air and reduce the bloated feeling in your abdomen.  No driving for 24 hours (because of the anesthesia (medicine) used during the test).  You may shower.  Do not sign any important legal documents or operate any machinery for 24 hours (because of the anesthesia used during the test).  NUTRITION Drink plenty of fluids.  You may resume your normal diet.  Begin with a light meal and progress to your normal diet.  Avoid alcoholic beverages for 24 hours or as instructed by your caregiver.  MEDICATIONS You may resume your normal medications unless your caregiver tells you otherwise.  WHAT YOU CAN EXPECT TODAY You may experience abdominal discomfort such as a feeling of fullness or "gas" pains.  FOLLOW-UP Your doctor will discuss the results of your test with you.  SEEK IMMEDIATE MEDICAL ATTENTION IF ANY OF THE FOLLOWING OCCUR: Excessive nausea (feeling sick to your stomach) and/or vomiting.  Severe abdominal pain and distention (swelling).  Trouble swallowing.  Temperature over 101 F (37.8 C).  Rectal bleeding or vomiting of blood.      Colonoscopy Discharge Instructions  Read the instructions outlined below and refer to this sheet in the next few weeks. These discharge instructions provide you  with general information on caring for yourself after you leave the hospital. Your doctor may also give you specific instructions. While your treatment has been planned according to the most current medical practices available, unavoidable complications occasionally occur.   ACTIVITY You may resume your regular activity, but move at a slower pace for the next 24 hours.  Take frequent rest periods for the next 24 hours.  Walking will help get rid of the air and reduce the bloated feeling in your belly (abdomen).  No driving for 24 hours (because of the medicine (anesthesia) used during the test).   Do not sign any important legal documents or operate any machinery for 24 hours (because of the anesthesia used during the test).  NUTRITION Drink plenty of fluids.  You may resume your normal diet as instructed by your doctor.  Begin with a light meal and progress to your normal diet. Heavy or fried foods are harder to digest and may make you feel sick to your stomach (nauseated).  Avoid alcoholic beverages for 24 hours or as instructed.  MEDICATIONS You may resume your normal medications unless your doctor tells you otherwise.  WHAT YOU CAN EXPECT TODAY Some feelings of bloating in the abdomen.  Passage of more gas than usual.  Spotting of blood in your stool or on the toilet paper.  IF YOU HAD POLYPS REMOVED DURING THE COLONOSCOPY: No aspirin products for 7 days or as instructed.  No alcohol for 7 days or as instructed.  Eat a soft diet for the next 24 hours.  FINDING OUT THE RESULTS OF YOUR TEST Not all test results  are available during your visit. If your test results are not back during the visit, make an appointment with your caregiver to find out the results. Do not assume everything is normal if you have not heard from your caregiver or the medical facility. It is important for you to follow up on all of your test results.  SEEK IMMEDIATE MEDICAL ATTENTION IF: You have more than a  spotting of blood in your stool.  Your belly is swollen (abdominal distention).  You are nauseated or vomiting.  You have a temperature over 101.  You have abdominal pain or discomfort that is severe or gets worse throughout the day.   Your EGD revealed mild amount inflammation in your stomach.  I took biopsies of this to rule out infection with a bacteria called H. pylori.  Small bowel appeared normal.  I did stretch your esophagus today.  Hopefully Supple feeling of food getting stuck.  I am going to send in a prescription for lansoprazole  30 mg daily.  I have sent this to your pharmacy.  Hopefully this medication is covered by insurance.  Your colonoscopy revealed 1 polyp(s) which I removed successfully. Await pathology results, my office will contact you. I recommend repeating colonoscopy in 7-10 years for surveillance purposes depending on pathology results.  Follow-up in GI office in 3 months. Office will call with appointment    I hope you have a great rest of your week!  Rolando Cliche. Mordechai April, D.O. Gastroenterology and Hepatology [facility]

## 2024-03-22 NOTE — Anesthesia Preprocedure Evaluation (Signed)
 Anesthesia Evaluation  Patient identified by MRN, date of birth, ID band Patient awake    Reviewed: Allergy & Precautions, H&P , NPO status , Patient's Chart, lab work & pertinent test results, reviewed documented beta blocker date and time   Airway Mallampati: II  TM Distance: >3 FB Neck ROM: full    Dental no notable dental hx.    Pulmonary neg pulmonary ROS, Current Smoker   Pulmonary exam normal breath sounds clear to auscultation       Cardiovascular Exercise Tolerance: Good hypertension, negative cardio ROS  Rhythm:regular Rate:Normal     Neuro/Psych negative neurological ROS  negative psych ROS   GI/Hepatic negative GI ROS, Neg liver ROS,,,  Endo/Other  negative endocrine ROS    Renal/GU negative Renal ROS  negative genitourinary   Musculoskeletal   Abdominal   Peds  Hematology negative hematology ROS (+)   Anesthesia Other Findings   Reproductive/Obstetrics negative OB ROS                             Anesthesia Physical Anesthesia Plan  ASA: 2  Anesthesia Plan: General   Post-op Pain Management:    Induction:   PONV Risk Score and Plan: Propofol infusion  Airway Management Planned:   Additional Equipment:   Intra-op Plan:   Post-operative Plan:   Informed Consent: I have reviewed the patients History and Physical, chart, labs and discussed the procedure including the risks, benefits and alternatives for the proposed anesthesia with the patient or authorized representative who has indicated his/her understanding and acceptance.     Dental Advisory Given  Plan Discussed with: CRNA  Anesthesia Plan Comments:        Anesthesia Quick Evaluation

## 2024-03-22 NOTE — Op Note (Signed)
 [facility] Patient Name: Derek Keller Procedure Date: 03/22/2024 10:25 AM MRN: [id number] Date of Birth: 1966-04-14 Attending MD: Rolando Cliche. Mordechai April , [place], [phone] CSN: [id number] Age: 58 Admit Type: Outpatient Procedure:                Upper GI endoscopy Indications:              Dysphagia, Heartburn Providers:                Rolando Cliche. Mordechai April, DO, Willena Harp, [place]                            Wilson, Technician, Theola Fitch Referring MD:              Medicines:                See the Anesthesia note for documentation of the                            administered medications Complications:            No immediate complications. Estimated Blood Loss:     Estimated blood loss was minimal. Procedure:                Pre-Anesthesia Assessment:                           - The anesthesia plan was to use monitored                            anesthesia care (MAC).                           After obtaining informed consent, the endoscope was                            passed under direct vision. Throughout the                            procedure, the patient's blood pressure, pulse, and                            oxygen saturations were monitored continuously. The                            GIF-H190 ([id number]) scope was introduced through the                            mouth, and advanced to the second part of duodenum.                            The upper GI endoscopy was accomplished without                            difficulty. The patient tolerated the procedure                            well. Scope In: 10:42:18  AM Scope Out: 10:47:14 AM Total Procedure Duration: 0 hours 4 minutes 56 seconds  Findings:      The Z-line was regular and was found 41 cm from the incisors.      No endoscopic abnormality was evident in the esophagus to explain the       patient's complaint of dysphagia. Preparations were made for empiric       dilation. A TTS dilator was passed through the  scope. Dilation with an       18-19-20 mm balloon dilator was performed to 20 mm. Dilation was       performed with a mild resistance at 20 mm. Estimated blood loss was none.      Patchy mild inflammation characterized by erythema was found in the       gastric body and in the gastric antrum. Biopsies were taken with a cold       forceps for Helicobacter pylori testing.      The duodenal bulb, first portion of the duodenum and second portion of       the duodenum were normal. Impression:               - Z-line regular, 41 cm from the incisors.                           - Gastritis. Biopsied.                           - Normal duodenal bulb, first portion of the                            duodenum and second portion of the duodenum. Moderate Sedation:      Per Anesthesia Care Recommendation:           - Patient has a contact number available for                            emergencies. The signs and symptoms of potential                            delayed complications were discussed with the                            patient. Return to normal activities tomorrow.                            Written discharge instructions were provided to the                            patient.                           - Resume previous diet.                           - Continue present medications.                           - Await pathology results.                           -  Repeat upper endoscopy PRN for retreatment.                           - Return to GI clinic in 3 months.                           - Use a proton pump inhibitor PO daily. Procedure Code(s):        --- Professional ---                           [phone], Esophagogastroduodenoscopy, flexible,                            transoral; with biopsy, single or multiple Diagnosis Code(s):        --- Professional ---                           K29.70, Gastritis, unspecified, without bleeding                           R13.10, Dysphagia,  unspecified                           R12, Heartburn CPT copyright 2022 American Medical Association. All rights reserved. The codes documented in this report are preliminary and upon coder review may  be revised to meet current compliance requirements. Rolando Cliche. Mordechai April, DO Rolando Cliche. Mordechai April, DO 03/22/2024 10:49:32 AM This report has been signed electronically. Number of Addenda: 0

## 2024-03-23 ENCOUNTER — Encounter (HOSPITAL_COMMUNITY): Payer: Self-pay | Admitting: Internal Medicine

## 2024-03-24 LAB — SURGICAL PATHOLOGY

## 2024-03-25 NOTE — Anesthesia Postprocedure Evaluation (Signed)
 Anesthesia Post Note  Patient: Derek Keller  Procedure(s) Performed: COLONOSCOPY EGD (ESOPHAGOGASTRODUODENOSCOPY) DILATION, ESOPHAGUS  Patient location during evaluation: Phase II Anesthesia Type: General Level of consciousness: awake Pain management: pain level controlled Vital Signs Assessment: post-procedure vital signs reviewed and stable Respiratory status: spontaneous breathing and respiratory function stable Cardiovascular status: blood pressure returned to baseline and stable Postop Assessment: no headache and no apparent nausea or vomiting Anesthetic complications: no Comments: Late entry   No notable events documented.   Last Vitals:  Vitals:   03/22/24 1113 03/22/24 1115  BP: (!) 94/51 113/68  Pulse: 61 61  Resp: (!) 23 (!) 21  Temp: 36.7 C   SpO2: 97% 99%    Last Pain:  Vitals:   03/22/24 1113  TempSrc: Oral  PainSc: 0-No pain                 Coretha Dew

## 2024-05-19 ENCOUNTER — Encounter: Payer: Self-pay | Admitting: Internal Medicine

## 2024-10-16 ENCOUNTER — Ambulatory Visit
Admission: EM | Admit: 2024-10-16 | Discharge: 2024-10-16 | Disposition: A | Discharge status: Home / Self Care | Attending: Family Medicine | Admitting: Family Medicine

## 2024-10-16 DIAGNOSIS — J069 Acute upper respiratory infection, unspecified: Secondary | ICD-10-CM | POA: Diagnosis not present

## 2024-10-16 DIAGNOSIS — J3089 Other allergic rhinitis: Secondary | ICD-10-CM | POA: Diagnosis not present

## 2024-10-16 MED ORDER — DEXAMETHASONE SOD PHOSPHATE PF 10 MG/ML IJ SOLN
10.0000 mg | Freq: Once | INTRAMUSCULAR | Status: AC
  Administered 2024-10-16: 10 mg via INTRAMUSCULAR

## 2024-10-16 MED ORDER — ALBUTEROL SULFATE HFA 108 (90 BASE) MCG/ACT IN AERS: 2.0000 | INHALATION_SPRAY | RESPIRATORY_TRACT | 0 refills | Status: AC | PRN

## 2024-10-16 MED ORDER — FLUTICASONE PROPIONATE 50 MCG/ACT NA SUSP: 1.0000 | Freq: Two times a day (BID) | NASAL | 2 refills | Status: AC

## 2024-10-16 NOTE — ED Provider Notes (Signed)
 [facility]    CSN: [id number] Arrival date & time: 10/16/24  0945      History   Chief Complaint No chief complaint on file.   HPI Derek Keller is a 58 y.o. male.   Patient presenting today with 5-day history of nasal congestion, sinus pressure, hacking cough, chest tightness, episode of shortness of breath overnight where he states he could not lay flat to go back to sleep, body aches, headache.  Denies fever, chills, chest pain, abdominal pain, vomiting, diarrhea.  States he is prone to sinus infections.  Taking his daily Zyrtec but otherwise not try anything over-the-counter for symptoms.    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   Past Surgical History:  Procedure Laterality Date   COLONOSCOPY N/A 03/22/2024   Procedure: COLONOSCOPY;  Surgeon: Cindie Carlin POUR, DO;  Location: [facility];  Service: Endoscopy;  Laterality: N/A;  10:45 am, asa 2   ESOPHAGEAL DILATION N/A 03/22/2024   Procedure: DILATION, ESOPHAGUS;  Surgeon: Cindie Carlin POUR, DO;  Location: [facility];  Service: Endoscopy;  Laterality: N/A;   ESOPHAGOGASTRODUODENOSCOPY N/A 03/22/2024   Procedure: EGD (ESOPHAGOGASTRODUODENOSCOPY);  Surgeon: Cindie Carlin POUR, DO;  Location: [facility];  Service: Endoscopy;  Laterality: N/A;   NASAL SEPTUM SURGERY         Home Medications    Prior to Admission medications   Medication Sig Start Date End Date Taking? Authorizing Provider  albuterol (VENTOLIN HFA) 108 (90 Base) MCG/ACT inhaler Inhale 2 puffs into the lungs every 4 (four) hours as needed. 10/16/24  Yes Stuart Vernell Norris, PA-C  fluticasone (FLONASE) 50 MCG/ACT nasal spray Place 1 spray into both nostrils 2 (two) times daily. 10/16/24  Yes Stuart Vernell Norris, PA-C  acetaminophen (TYLENOL) 650 MG CR tablet Take 650 mg by mouth every 8 (eight) hours  as needed for pain.    [provider]  acyclovir (ZOVIRAX) 400 MG tablet Take 400 mg by mouth 3 (three) times daily. 09/23/23   [provider]  celecoxib (CELEBREX) 200 MG capsule Take 200 mg by mouth 2 (two) times daily.    [provider]  cetirizine (ZYRTEC) 10 MG tablet Take by mouth.    [provider]  cyanocobalamin (VITAMIN B12) 1000 MCG tablet Take 1,000 mcg by mouth daily.    [provider]  folic acid (FOLVITE) 1 MG tablet Take 1 mg by mouth daily. 09/03/20   [provider]  lansoprazole  (PREVACID ) 30 MG capsule Take 1 capsule (30 mg total) by mouth daily at 12 noon. 03/22/24 03/22/25  Cindie Carlin POUR, DO  Methotrexate, PF, 25 MG/0.5ML SOAJ Inject 0.8 mLs into the skin once a week.    [provider]    Family History Family History  Problem Relation Age of Onset   Hypertension Mother    Hypertension Father     Social History Social History   Tobacco Use   Smoking status: Every Day    Current packs/day: 1.50    Types: Cigarettes, Cigars   Smokeless tobacco: Never  Vaping Use   Vaping status: Never Used  Substance Use Topics   Alcohol use: Not Currently   Drug use: Not Currently     Allergies   Tropicamide, Cat dander, Dust mite extract, and Augmentin [amoxicillin -pot clavulanate]   Review of Systems Review  of Systems PER HPI  Physical Exam Triage Vital Signs ED Triage Vitals  Encounter Vitals Group     BP 10/16/24 1011 119/77     Girls Systolic BP Percentile --      Girls Diastolic BP Percentile --      Boys Systolic BP Percentile --      Boys Diastolic BP Percentile --      Pulse Rate 10/16/24 1011 62     Resp 10/16/24 1011 18     Temp 10/16/24 1011 98.6 F (37 C)     Temp Source 10/16/24 1011 Oral     SpO2 10/16/24 1011 97 %     Weight --      Height --      Head Circumference --      Peak Flow --      Pain Score 10/16/24 1014 0     Pain Loc --      Pain Education --       Exclude from Growth Chart --    No data found.  Updated Vital Signs BP 119/77 (BP Location: Right Arm)   Pulse 62   Temp 98.6 F (37 C) (Oral)   Resp 18   SpO2 97%   Visual Acuity Right Eye Distance:   Left Eye Distance:   Bilateral Distance:    Right Eye Near:   Left Eye Near:    Bilateral Near:     Physical Exam Vitals and nursing note reviewed.  Constitutional:      Appearance: He is well-developed.  HENT:     Head: Atraumatic.     Right Ear: Tympanic membrane and external ear normal.     Left Ear: Tympanic membrane and external ear normal.     Nose: Rhinorrhea present.     Mouth/Throat:     Pharynx: Posterior oropharyngeal erythema present. No oropharyngeal exudate.  Eyes:     Conjunctiva/sclera: Conjunctivae normal.     Pupils: Pupils are equal, round, and reactive to light.  Cardiovascular:     Rate and Rhythm: Normal rate and regular rhythm.  Pulmonary:     Effort: Pulmonary effort is normal. No respiratory distress.     Breath sounds: No wheezing or rales.  Musculoskeletal:        General: Normal range of motion.     Cervical back: Normal range of motion and neck supple.  Lymphadenopathy:     Cervical: No cervical adenopathy.  Skin:    General: Skin is warm and dry.  Neurological:     Mental Status: He is alert and oriented to person, place, and time.  Psychiatric:        Behavior: Behavior normal.      UC Treatments / Results  Labs (all labs ordered are listed, but only abnormal results are displayed) Labs Reviewed - No data to display  EKG   Radiology No results found.  Procedures Procedures (including critical care time)  Medications Ordered in UC Medications  dexamethasone  (DECADRON ) injection 10 mg (10 mg Intramuscular Given 10/16/24 1115)    Initial Impression / Assessment and Plan / UC Course  I have reviewed the triage vital signs and the nursing notes.  Pertinent labs & imaging results that were available during my care of  the patient were reviewed by me and considered in my medical decision making (see chart for details).     Suspect viral upper respiratory infection and seasonal allergy exacerbation.  Vitals and exam very reassuring today.  Will treat  with IM Decadron , continued allergy regimen including nasal sprays such as Flonase and or Astelin and he is requesting an albuterol inhaler for episodes such as the one he had last night.  Discussed report of home care and return precautions.  Final Clinical Impressions(s) / UC Diagnoses   Final diagnoses:  Viral URI with cough  Seasonal allergic rhinitis due to other allergic trigger   Discharge Instructions   None    ED Prescriptions     Medication Sig Dispense Auth. Provider   albuterol (VENTOLIN HFA) 108 (90 Base) MCG/ACT inhaler Inhale 2 puffs into the lungs every 4 (four) hours as needed. 18 g Stuart Vernell Norris, PA-C   fluticasone (FLONASE) 50 MCG/ACT nasal spray Place 1 spray into both nostrils 2 (two) times daily. 16 g Stuart Vernell Norris, [place]      PDMP not reviewed this encounter.   Stuart Vernell Norris, [place] 10/16/24 1400

## 2024-10-16 NOTE — ED Triage Notes (Signed)
 Pt reports congestion, cough, post nasal drip,  sinus pressure, headache, body ache, ear fullness x 5 days.
